# Patient Record
Sex: Female | Born: 1935 | Race: White | Hispanic: No | Marital: Married | Smoking: Never smoker
Health system: Southern US, Community
[De-identification: ages and names within clinical notes are randomized; demographics above are authoritative.]

## PROBLEM LIST (undated history)

## (undated) DIAGNOSIS — I482 Chronic atrial fibrillation, unspecified: Secondary | ICD-10-CM

## (undated) DIAGNOSIS — I429 Cardiomyopathy, unspecified: Secondary | ICD-10-CM

## (undated) DIAGNOSIS — I10 Essential (primary) hypertension: Secondary | ICD-10-CM

## (undated) DIAGNOSIS — R55 Syncope and collapse: Secondary | ICD-10-CM

## (undated) DIAGNOSIS — I739 Peripheral vascular disease, unspecified: Secondary | ICD-10-CM

## (undated) DIAGNOSIS — E039 Hypothyroidism, unspecified: Secondary | ICD-10-CM

## (undated) DIAGNOSIS — K219 Gastro-esophageal reflux disease without esophagitis: Secondary | ICD-10-CM

## (undated) DIAGNOSIS — I779 Disorder of arteries and arterioles, unspecified: Secondary | ICD-10-CM

## (undated) DIAGNOSIS — M1990 Unspecified osteoarthritis, unspecified site: Secondary | ICD-10-CM

## (undated) HISTORY — PX: CORNEAL TRANSPLANT: SHX108

## (undated) HISTORY — DX: Unspecified osteoarthritis, unspecified site: M19.90

## (undated) HISTORY — DX: Syncope and collapse: R55

## (undated) HISTORY — DX: Peripheral vascular disease, unspecified: I73.9

## (undated) HISTORY — DX: Cardiomyopathy, unspecified: I42.9

## (undated) HISTORY — DX: Chronic atrial fibrillation, unspecified: I48.20

## (undated) HISTORY — DX: Gastro-esophageal reflux disease without esophagitis: K21.9

## (undated) HISTORY — DX: Disorder of arteries and arterioles, unspecified: I77.9

## (undated) HISTORY — PX: CHOLECYSTECTOMY: SHX55

## (undated) HISTORY — PX: OTHER SURGICAL HISTORY: SHX169

---

## 2005-01-27 ENCOUNTER — Ambulatory Visit: Payer: Self-pay | Admitting: Cardiology

## 2005-07-21 ENCOUNTER — Ambulatory Visit: Payer: Self-pay | Admitting: Cardiology

## 2005-07-22 ENCOUNTER — Ambulatory Visit: Payer: Self-pay | Admitting: Cardiology

## 2005-07-28 ENCOUNTER — Ambulatory Visit: Payer: Self-pay | Admitting: Cardiology

## 2005-09-22 ENCOUNTER — Ambulatory Visit: Payer: Self-pay | Admitting: Cardiology

## 2005-12-04 ENCOUNTER — Ambulatory Visit: Payer: Self-pay | Admitting: Cardiology

## 2005-12-30 ENCOUNTER — Ambulatory Visit: Payer: Self-pay | Admitting: Cardiology

## 2006-06-14 ENCOUNTER — Ambulatory Visit: Payer: Self-pay | Admitting: Cardiology

## 2007-09-07 ENCOUNTER — Ambulatory Visit: Payer: Self-pay | Admitting: Cardiology

## 2008-10-10 DIAGNOSIS — I4892 Unspecified atrial flutter: Secondary | ICD-10-CM

## 2008-10-10 DIAGNOSIS — I2510 Atherosclerotic heart disease of native coronary artery without angina pectoris: Secondary | ICD-10-CM | POA: Insufficient documentation

## 2008-10-10 DIAGNOSIS — I10 Essential (primary) hypertension: Secondary | ICD-10-CM | POA: Insufficient documentation

## 2008-10-10 DIAGNOSIS — R55 Syncope and collapse: Secondary | ICD-10-CM

## 2010-05-26 NOTE — Assessment & Plan Note (Signed)
[facility]                          [facility]   NAME:[name] [name], [name] [name]            MRN:          [id number]  DATE:06/14/2006                            DOB:          [birth year]    HISTORY OF PRESENT ILLNESS:  The patient is an 77 year old female with a  history of palpitations and vasovagal syncope.  The patient has been  doing well.  She has had no recurrent spells.  Unfortunately, she took  herself off of beta blocker therapy.  Planned to go on a trip and she is  concerned that she may not remember when to take the medications.  She  has also had a positive sleep study but continues to refuse treatment  with CPAP.   MEDICATIONS:  Currently none.   PHYSICAL EXAMINATION:  VITAL SIGNS:  Blood pressure 133/88, heart rate  is 82 beats per minute.  Weight 152 pounds.  GENERAL:  Well-nourished white female no apparent distress.  EYES:  Pupils and sclerae clear.  Conjunctivae clear.  NECK:  Supple.  Normal carotid upstroke.  No carotid bruits.  LUNGS:  Clear breath sounds bilaterally.  HEART:  Regular rate and rhythm.  Normal S1, S2.  No murmurs, rubs, or  gallops.  ABDOMEN:  Soft.  EXTREMITIES:  No cyanosis, clubbing, or edema.  NEURO:  The patient is alert and oriented, grossly nonfocal.   PROBLEM LIST:  1. History of pulmonary emboli.  No recurrence by CT.  2. Vasovagal syncope.  No recurrence.  3. Mild left ventricular dysfunction, ejection fraction 45%.  4. Hypertension.  5. Paroxysmal atrial flutter.  6. Obstructive sleep apnea.   PLAN:  1. The patient was told to restart her beta blocker therapy.  She will      do so with Toprol XL 50 mg a day.  2. She continues to decline CPAP treatment.  3. No further cardiovascular workup is required.  The patient is      currently asymptomatic.     Learta Codding, MD,FACC  Electronically Signed    GED/MedQ  DD: 06/14/2006  DT: 06/14/2006  Job #: [id number]   cc:   Doreen Beam

## 2010-05-26 NOTE — Assessment & Plan Note (Signed)
[facility]                          [facility]   NAME:[name], [name] CHISOM            MRN:          [id number]  DATE:09/07/2007                            DOB:          13-Sep-1923    PRIMARY CARDIOLOGIST:  Learta Codding, MD,FACC   REASON FOR VISIT:  One-year followup.   [name] [name] reports no interim development of symptoms suggestive of angina  pectoris, decompensated heart failure, or recurrent tachy palpitations.   She does refer to a sensation of bilateral leg cramps, which occurs only  when she lays in bed at night.  However, she denies any symptoms  suggestive of intermittent claudication.   Electrocardiogram today reveals [name] SR of 74 bpm with normal axis and  question of prior septal infarct; there were no changes from her  previous study of 1 year ago.   CURRENT MEDICATIONS:  1. Toprol-XL 50 mg daily.  2. Aspirin 81 daily.   PHYSICAL EXAMINATION:  VITAL SIGNS:  Blood pressure 115/65; pulse 76,  regular; and weight 163.  GENERAL:  [name] [name], sitting upright, in no distress.  HEENT:  Normocephalic and atraumatic.  NECK:  Palpable bilateral carotid pulses without bruits; no JVD at 90  degrees.  LUNGS:  Clear to auscultation in all fields.  HEART:  RRR (S1S2) no significant murmurs.  No rubs or gallops.  ABDOMEN:  Protuberant, nontender.  EXTREMITIES:  Palpable dorsalis pedis pulses with no significant edema.  NEURO:  No focal deficit.   IMPRESSION:  1. History of paroxysmal atrial flutter.  1a.  Maintaining NSR.  1. Mild left ventricular dysfunction.  2a.  Ejection fraction of 45% by 2-D echo, January 2007.  1. History of pulmonary emboli.  3a.  Treated with 67-month course of Coumadin.  3b.  Associated right lower extremity thrombus.  1. Hypertension.  2. Obstructive sleep apnea, refuses CPAP.  3. History of vasovagal syncope.   PLAN:  1. Continue current medication regimen.  2. Consider repeat  2-D echo at time of next scheduled followup, for      reassessment of left ventricular function.  3. Schedule return-to-clinic followup with Dr. Andee Lineman in 1 year, or      sooner as needed.      Gene Serpe, PA-C  Electronically Signed      Learta Codding, MD,FACC  Electronically Signed   GS/MedQ  DD: 09/07/2007  DT: 09/08/2007  Job #: [id number]   cc:   Doreen Beam, MD

## 2010-05-29 NOTE — Assessment & Plan Note (Signed)
[facility]                            [facility]   NAME:[name] [name], [name] [name]            MRN:          [id number]  DATE:09/22/2005                            DOB:          February 01, 1923    HISTORY OF PRESENT ILLNESS:  Patient is a 77 year old female.  The patient  is a 77 year old female.  The patient has history of vasovagal syncope.  She  wore recently an event monitor.  No significant pauses were noted.  The  patient did have a couple of brief runs of atrial tachycardia, 3 to 4 beats,  but was asymptomatic with this.  She denies any substernal chest pressure,  shortness of breath, orthopnea, PND.  Previous CT scan did not show any  recurrent pulmonary emboli.   MEDICATIONS:  1. Toprol XL 100 mg a day.  2. Aspirin 81 mg a day.   PHYSICAL EXAMINATION:  VITAL SIGNS:  Blood pressure is 100/64, heart 64.  NECK:  Normal carotid upstroke.  No carotid bruits.  LUNGS:  Clear breath sounds bilaterally.  HEART:  Regular rate and rhythm.  Normal S1, S2.  ABDOMEN:  Soft.  EXTREMITIES:  No cyanosis, clubbing or edema.   PROBLEM LIST:  1. History of pulmonary emboli, no recurrence by CT.  2. Vasovagal syncope, no recurrence.  3. Mild LV dysfunction, ejection fraction 45%.  4. History of paroxysmal atrial flutter.   PLAN:  1. The patient is doing well, appears she has no recurrent syncope.  At      this point, I think she would __________  more with Dr. Graciela Husbands.  2. The patient does have frequent PACs and atrial tachycardia on the event      monitor.  She has recurrent episodes of pre-syncope.  Will plan on a      CardioMed monitor.  3. Patient will follow up with [place] in 6 months and will check PFTs and      apnea link.                                   Learta Codding, MD,FACC   GED/MedQ  DD:  09/22/2005  DT:  09/23/2005  Job #:  [id number]   cc:   Doreen Beam

## 2010-05-29 NOTE — Assessment & Plan Note (Signed)
[facility]                          [facility]   NAME:[name] [name], [name] [name]            MRN:          [id number]  DATE:12/30/2005                            DOB:          [birth year]    HISTORY OF PRESENT ILLNESS:  Patient is an 77 year old with prior  history of vasovagal episodes.  The patient has had no recurrent  syncope.  She has been ruled out for pulmonary emboli by CT scan.  The  patient most recently wore a CardioNet monitor which showed a single  episode of PSVT of 10 beats, but no other significant arrhythmias.  Patient states that she has been doing quite well, and denies any chest  pain or shortness of breath.  She did have an ApneLink monitor donewhich  was positive with evidence for sleep disorder breathing.  She also had a  formal sleep study done, but the results are currently not available  yet.   MEDICATIONS:  1. Toprol XL 100 mg a day.  2. Aspirin 81 mg p.o. daily.   PHYSICAL EXAMINATION:  Blood pressure is 140/76.  Heart rate 72.  NECK  EXAM:  Normal carotid upstroke.  No bruits.  LUNGS:  Clear breath sounds bilaterally.  HEART:  Regular rate and rhythm.  Normal S1 and S2.  No murmurs, rubs or  gallops.  ABDOMEN:  Soft.  EXTREMITY EXAM:  No edema.   PROBLEM LIST:  1. History of pulmonary emboli.  No recurrence by computed tomography.  2. Vasovagal syncope.  No recurrence.      a.     Status post CardioNet monitor with a single episode       paroxysmal supraventricular tachycardia at 10 beats.  3. Mild left ventricular dysfunction.  Ejection fraction 45%.  4. History of paroxysmal atrial flutter, currently in normal sinus      rhythm.  5. Rule out sleep disordered breathing.   PLAN:  1. Th patient has a positive sleep study, but is not willing to      consider CPAP at this time  2. Cardiac monitor not showing any significant arrhythmias.  Continue      medical therapy.  3. We will follow the  patient up in 1 year.     Learta Codding, MD,FACC  Electronically Signed    GED/MedQ  DD: 12/30/2005  DT: 12/30/2005  Job #: [id number]

## 2010-05-29 NOTE — Assessment & Plan Note (Signed)
[facility]                            [facility]   NAME:[name] [name], [name] [name]            MRN:          [id number]  DATE:07/28/2005                            DOB:          [birth year]    PRIMARY CARDIOLOGIST:  Learta Codding, MD, [facility].   REASON FOR OFFICE VISIT:  [name] [name] now returns for a scheduled office  followup.  Please refer to my initial consultation note of July 21, 2005,  for full details.  At that time, the patient presented for post hospital  followup and consultation for recurrent syncope, after being recently  hospitalized here at [facility].  It was noted by Dr. Andee Lineman at the time of  followup that she had actually been seen by [place] initially in 2005, by Dr.  Andee Lineman, for syncope which was felt to be vasovagal at that time.  No further  cardiac workup was recommended at that time.  Most recently, the patient was  diagnosed with pulmonary embolus in January 2007, and was treated with a 6-  month course of Coumadin.  She had just been taken off Coumadin prior to our  recent visit.  During this current hospitalization, the patient was kept for  overnight observation and discharged with a diagnosis of possible TIA.  In  followup, we recommended repeating a CT scan of the chest as well as the D-  dimer to definitively exclude any recurrent or persistent pulmonary emboli.  A CT scan of the chest was done and it showed no evidence of pulmonary  embolus, in fact, the small emboli seen in January '07, had since resolved.  There was, however, indication of a hypodense focus in the pancreatic tail  suggestive of either a pseudocyst versus a possible cystic pancreatic  neoplasm.  The patient was also placed on a 30-day event monitor.  She  activated this for 1 spell and telemetry reviewed today reveals normal  sinus rhythm with frequent PACs.   Regarding symptoms, the patient reports only 1 spell which consists of her  sitting at  the computer desk and suddenly feeling like she is about to pass  out.  This time, however, she was able to place herself in bed and states  that she slept for 1 hour.  When she awoke, she felt fine and had no  recurrent symptoms.   A recent D-dimer, ordered by [place], was negative (421).   CURRENT MEDICATIONS:  1.  Toprol XL 100 mg every day.  2.  Aspirin 81 mg every day.   PHYSICAL EXAMINATION:  VITAL SIGNS:  Blood pressure 114/60, pulse 74  regular, weight 150.  NECK:  Palpable carotid pulses without bruits.  LUNGS:  Clear to auscultation all fields.  HEART:  Regular rate and rhythm (S1 and S2).  No significant murmurs.  EXTREMITIES:  Palpable pulses without edema.  NEUROLOGIC:  No focal deficit.   IMPRESSION:  [name] [name] is an 77 year old female, recently referred to [place] for  outpatient consultation of recurrent syncope, previously seen by [place] in 2005,  with what was felt to be vasovagal syncope at that time.  She does have a  history of pulmonary embolus, diagnosed in January 2007, but a followup CT  scan recently ordered by [place] shows a complete resolution of this.  The  patient was treated with a 21-month course of Coumadin.  The patient has no known history of coronary artery disease and has a  history of paroxysmal atrial flutter with rapid ventricular response,  spontaneously converted to normal sinus rhythm in January 2007.  She also  had mild left ventricular dysfunction (ejection fraction 45%) by  echocardiogram in January 2007, consistent with a restrictive physiology.   PLAN:  At this point, the etiology of the patient's recent episode of  syncope is unclear.  We have excluded recurrent pulmonary embolus as a  possible etiology, but we will continue to keep her on the 30-day event  monitor, which she just recently started to exclude possible dysrhythmia.  Moreover we will proceed with scheduling her for an evaluation with Dr.  Sherryl Manges for further recommendations (i.e.,  tilt table testing).  We  have also requested records pertaining to a prior carotid Doppler ultrasound  reportedly done back in January 2007, which suggested presence of  nonocclusive disease.                                   Gene Serpe, PA-C                                Learta Codding, MD, [facility]   GS/MedQ  DD:  07/28/2005  DT:  07/28/2005  Job #:  [id number]   cc:   Doreen Beam

## 2010-05-29 NOTE — Assessment & Plan Note (Signed)
[facility]                            [facility]   NAME:[name] [name], [name] [name]            MRN:          [id number]  DATE:07/21/2005                            DOB:          [birth year]    CONSULTATION NOTE:   REFERRING PHYSICIAN:  Dr. Doreen Beam   DATE OF CONSULTATION:  July 21, 2005   PRIMARY CARDIOLOGIST:  Dr. Lewayne Bunting   REASON FOR CONSULTATION:  Recurrent syncope.   Ms. Coleson is an 77 year old female, who was initially referred to [place] in June  2005 for syncope and was seen by Dr. Lewayne Bunting, and who now presents to [place]  following recent hospitalization here at [facility] with recurrent syncope.   In June 2005, the patient was felt to have vasovagal syncope and no further  cardiac workup was recommended by Dr. Andee Lineman at that time.  She was noted to  have no prior history of coronary artery disease and had negative cardiac  markers and EKGs.   In January 2007, the patient presented with syncope and was diagnosed with  pulmonary embolus and placed on Coumadin for 6 months.  In fact, she was  just taken off Coumadin yesterday during her scheduled post-hospital  followup.  At the same time in January, the patient reports to me that she  was found to have some carotid artery disease (carotid Dopplers are  currently unavailable) but apparently not sufficiently occlusive to require  intervention.   Most recently, the patient was hospitalized here overnight following a  syncopal episode.  She was found slumped over on her computer desk, by her  husband, and was taken to the emergency room.  She ruled out for myocardial  infarction with negative serial cardiac markers.  A CT scan of the head was  negative.  Telemetry apparently was benign.   The patient was discharged with diagnosis of possible TIA.  She is now  referred to [place] today for further evaluation.   Since her recent hospitalization, the patient reports no further  syncope.  Of note, she also denies any remote or recent history of exertional angina  pectoris, dyspnea, paroxysmal  nocturnal dyspnea, orthopnea, or  tachypalpitations.   ALLERGIES:  ACETAMINOPHEN, NONSTEROIDALS, AND SUGAR SUBSTITUTE.   CURRENT MEDICATIONS:  1.  Toprol XL 100 daily.  2.  Aspirin 81 daily.   PAST MEDICAL HISTORY:  As outlined above.  Additionally, pancreatic cyst,  reported history of congestive heart failure, status post cholecystectomy,  irritable bowel syndrome, and hyperlipidemia.   SOCIAL HISTORY:  The patient is married, has three children.  She has never  smoked tobacco and denies alcohol use.   FAMILY HISTORY:  Negative for premature coronary artery disease, sudden  death, or history of arrhythmias.   REVIEW OF SYSTEMS:  As outlined per HPI, remaining systems negative.   PHYSICAL EXAMINATION:  VITAL SIGNS:  Blood pressure 110/74, pulse 50,  regular, weight 151.  GENERAL:  An 77 year old female, sitting upright, in no apparent distress.  HEENT:  Normocephalic, atraumatic.  NECK:  Palpable carotid pulses without bruits.  LUNGS:  Clear to auscultation in all fields.  HEART:  Regular rate  and rhythm (S1 and S2), no murmurs, rubs, or gallops.  ABDOMEN:  Soft, nontender with intact bowel sounds.  EXTREMITIES:  Palpable distal pulses without significant pedal edema.  NEUROLOGICAL:  No focal deficit.   IMPRESSION:  1.  Recurrent syncope.      1.  Associated pulmonary embolus January 2007.      2.  Mild, nonocclusive right posterior tibial vein thrombus by venous          duplex imaging January 2007.  2.  Mild left ventricular dysfunction (ejection fraction 45% by      echocardiogram January 2007).      1.  Consistent with restrictive physiology.      2.  Moderate tricuspid regurgitation.  3.  History of atrial flutter with rapid ventricular response.      1.  Spontaneous conversion to normal sinus rhythm January 2007.  4.  Question recent transient  ischemia attack.  5.  Hyperlipidemia.   PLAN:  The patient presents to the office following a recent recurrent  episode of syncope following overnight observation here at [facility].  She was discharged with diagnosis of possible TIA.  Of note,  however, a D-dimer was not drawn most recently but she did have an elevated  D-dimer of 639 in May 2007.  She has just completed a 71-month course of  anticoagulation for treatment of pulmonary embolus.  She states that this  most recent episode of syncope was unlike that which she experienced in  January 2007.  Again, she reported no prodromal symptoms or warning of any  kind other than the experience of feeling tired just prior to blacking  out.  She also reports to me today that she felt confused for  approximately 1 hour after regaining consciousness, the duration of which  she reports was approximately 1 hour in duration as well.   Recommendation today is to repeat a D-dimer level as well as a CT scan of  the chest to exclude recurrent pulmonary embolus.  Additionally, we will  place her on an event monitor to exclude an arrhythmogenic etiology for her  syncope.  We will have her return to the clinic here to follow up with Dr.  Andee Lineman in 1 week for review and further recommendations.  In the interim, we  will also schedule her for an evaluation with Dr. Sherryl Manges regarding  further workup for her syncope (i.e., tilt table testing).                                   Gene Serpe, PA-C                                Learta Codding, MD, [facility]   GS/MedQ  DD:  07/21/2005  DT:  07/21/2005  Job #:  [id number]

## 2010-07-27 ENCOUNTER — Telehealth: Payer: Self-pay | Admitting: Cardiovascular Disease

## 2010-07-27 NOTE — Telephone Encounter (Signed)
Missed her home care appointment on Friday and today.  [name] [name] at [facility] said that she is not answering her door; thinking that she may be asleep. She has notified her next of kin and just wanted [place] to know.

## 2012-08-16 DIAGNOSIS — R55 Syncope and collapse: Secondary | ICD-10-CM

## 2013-11-14 ENCOUNTER — Other Ambulatory Visit: Payer: Self-pay | Admitting: *Deleted

## 2015-08-27 ENCOUNTER — Inpatient Hospital Stay (HOSPITAL_COMMUNITY)
Admission: AD | Admit: 2015-08-27 | Discharge: 2015-09-02 | DRG: 308 | Disposition: A | Discharge status: Home Health | Payer: Medicare Other | Source: Other Acute Inpatient Hospital | Attending: Internal Medicine | Admitting: Internal Medicine

## 2015-08-27 DIAGNOSIS — R072 Precordial pain: Secondary | ICD-10-CM | POA: Diagnosis not present

## 2015-08-27 DIAGNOSIS — I082 Rheumatic disorders of both aortic and tricuspid valves: Secondary | ICD-10-CM | POA: Diagnosis present

## 2015-08-27 DIAGNOSIS — E871 Hypo-osmolality and hyponatremia: Secondary | ICD-10-CM | POA: Diagnosis present

## 2015-08-27 DIAGNOSIS — E039 Hypothyroidism, unspecified: Secondary | ICD-10-CM | POA: Diagnosis present

## 2015-08-27 DIAGNOSIS — I482 Chronic atrial fibrillation: Secondary | ICD-10-CM | POA: Diagnosis present

## 2015-08-27 DIAGNOSIS — I447 Left bundle-branch block, unspecified: Secondary | ICD-10-CM | POA: Diagnosis present

## 2015-08-27 DIAGNOSIS — I5023 Acute on chronic systolic (congestive) heart failure: Secondary | ICD-10-CM | POA: Diagnosis present

## 2015-08-27 DIAGNOSIS — R071 Chest pain on breathing: Secondary | ICD-10-CM | POA: Diagnosis not present

## 2015-08-27 DIAGNOSIS — R296 Repeated falls: Secondary | ICD-10-CM | POA: Diagnosis present

## 2015-08-27 DIAGNOSIS — R0602 Shortness of breath: Secondary | ICD-10-CM | POA: Diagnosis not present

## 2015-08-27 DIAGNOSIS — R159 Full incontinence of feces: Secondary | ICD-10-CM | POA: Diagnosis present

## 2015-08-27 DIAGNOSIS — I420 Dilated cardiomyopathy: Secondary | ICD-10-CM

## 2015-08-27 DIAGNOSIS — R05 Cough: Secondary | ICD-10-CM | POA: Diagnosis not present

## 2015-08-27 DIAGNOSIS — I4892 Unspecified atrial flutter: Secondary | ICD-10-CM | POA: Diagnosis present

## 2015-08-27 DIAGNOSIS — I959 Hypotension, unspecified: Secondary | ICD-10-CM | POA: Diagnosis present

## 2015-08-27 DIAGNOSIS — R0789 Other chest pain: Secondary | ICD-10-CM | POA: Diagnosis present

## 2015-08-27 DIAGNOSIS — I110 Hypertensive heart disease with heart failure: Secondary | ICD-10-CM | POA: Diagnosis present

## 2015-08-27 DIAGNOSIS — I4891 Unspecified atrial fibrillation: Secondary | ICD-10-CM | POA: Diagnosis not present

## 2015-08-27 DIAGNOSIS — K219 Gastro-esophageal reflux disease without esophagitis: Secondary | ICD-10-CM | POA: Diagnosis present

## 2015-08-27 DIAGNOSIS — I5021 Acute systolic (congestive) heart failure: Secondary | ICD-10-CM

## 2015-08-27 DIAGNOSIS — R29898 Other symptoms and signs involving the musculoskeletal system: Secondary | ICD-10-CM | POA: Diagnosis not present

## 2015-08-27 DIAGNOSIS — R079 Chest pain, unspecified: Secondary | ICD-10-CM | POA: Diagnosis not present

## 2015-08-27 DIAGNOSIS — R059 Cough, unspecified: Secondary | ICD-10-CM | POA: Diagnosis present

## 2015-08-27 HISTORY — DX: Essential (primary) hypertension: I10

## 2015-08-27 HISTORY — DX: Hypothyroidism, unspecified: E03.9

## 2015-08-27 NOTE — Consult Note (Signed)
Admit date: 08/27/2015 Referring Physician  Dr. Guadalupe DawnKevin Greer, MD [facility] Primary Cardiologist  None Reason for Consultation  Aflutter with RVR  HPI: This is a very elderly, frail 82yo WF with a history of chronic atrial fibrillation not on chronic anticoagulation (patient does not know why but most likely due to frequent falls and advanced age), diverticulosis with chronic fecal incontinence, GERD, anxiety, arthritis, ? Pancreatic tumor (per medical [place]) and hypotension.  She apparently has a history of syncope in the past and she tells me that she has passed out several times in 7 of the past 17 years. She apparently was shopping yesterday and her legs got very heavy and wouldn't work and she has to stop.  [facility] stated that she had syncope but she denies any dizziness and says that she did not lose consciousness.  She denies any fever, chills but has had a cough that is minimally productive.  Over the past few days and noticed increased SOB.  She has also had chest discomfort for the past 1-2 days that she describes as a wire across her chest and her back that is not worse with inspiration but is worse laying supine.  [facility] and was found to be hyponatremic with Na 127 and normal trop .  CXR showed patchy density at the lung bases felt to be atelectalsis or mild basilar PNA.  She was hypertensive in the ER with BP 101-143/76-15111mmHg.  TSH was noted to be low at 5.72 and free T4 1.32.  She was found to be in atrial flutter with RVR and was given Cardizem bolus of 50mg  total and started on a gtt. Of note, [place] apparently was in [place] due to "syncope" but patient says that her spells are mainly that her legs get heavy and she cannot walk.  She did feel lightheaded but did not pass out a few days ago.    Cardiology is now asked to consult for further evaluation and treatment of aflutter and CP.     PMH:  No past medical  history on file.   PSH:  No past surgical history on file.  Allergies:  Review of patient's allergies indicates not on file. Prior to Admit Meds:   No prescriptions prior to admission.   Fam HX:   No family history on file. Social HX:    Social History   Social History  . Marital status: Married    Spouse name: N/A  . Number of children: N/A  . Years of education: N/A   Occupational History  . Not on file.   Social History Main Topics  . Smoking status: Not on file  . Smokeless tobacco: Not on file  . Alcohol use Not on file  . Drug use: Unknown  . Sexual activity: Not on file   Other Topics Concern  . Not on file   Social History Narrative  . No narrative on file     ROS:  All 11 ROS were addressed and are negative except what is stated in the HPI  Physical Exam: Blood pressure 113/67, pulse 73, temperature 97.9 F (36.6 C), temperature source Oral, resp. rate 17, height 5\' 1"  (1.549 m), weight 152 lb 14.4 oz (69.4 kg), SpO2 100 %.    General: Well developed, well nourished, in no acute distress Head: Eyes PERRLA, No xanthomas.   Normal cephalic and atramatic  Lungs:   Crackles at  left base Heart:   RRR S1 S2 Pulses are 2+ & equal.            No carotid bruit. No JVD.  No abdominal bruits. No femoral bruits. Abdomen: Bowel sounds are positive, abdomen soft and non-tender without masses  Msk:  Back normal, normal gait. Normal strength and tone for age. Extremities:   No clubbing, cyanosis or edema.  DP +1 Neuro: Alert and oriented X 3. Psych:  Good affect, responds appropriately    Labs:  No results found for: WBC, HGB, HCT, MCV, PLT No results for input(s): NA, K, CL, CO2, BUN, CREATININE, CALCIUM, PROT, BILITOT, ALKPHOS, ALT, AST, GLUCOSE in the last 168 hours.  Invalid input(s): LABALBU No results found for: PTT No results found for: INR, PROTIME No results found for: CKTOTAL, CKMB, CKMBINDEX, TROPONINI  No results found for: CHOL No results found for:  HDL No results found for: LDLCALC No results found for: TRIG No results found for: CHOLHDL No results found for: LDLDIRECT    Radiology:  No results found.  EKG:  Atrial flutter with RVR and LBBB  ASSESSMENT/PLAN: 1.  Chronic atrial flutter/atrial fibrillation now presenting with RVR. Unsure what triggered the rapid heart rate.  Her HR is now in the 50-60's off Cardizem gtt.  Will keep off Cardizem gtt for now as she is bradycardic.  She has not been on chronic anticoagulation in the past and I suspect due to frequent falls and advanced age.  Her Free T4 is elevated along with her TSH.  2.  Chest pain that is somewhat atypical and worse with laying flat.  She does have a chest xray with ? Bibasilar PNA.  She has never smoked and really has no cardiac risk factors other than advanced age.  She has a LBBB on EKG with no old EKG to compare to.  Given her advanced age would pursue a more conservative approach.  Will check a 2D echo to assess LVF.  Will cycle troponins and further workup based on echo.    3.  SOB of several days duration with cough but no fever or chills.  CXR with ? Bibasilar PNA.  Will check 2D echo to assess LVF and check BNP to make sure there is not a component of CHF.  She was in aflutter with RVR on arrival so this could have been etiology of SOB with CHF although CXR not consistent with that.   Also unknown duration of aflutter with RVR so could have tachycardia induced DCM.    4.  Leg heaviness and weakness with frequent falls of unknown etiology.  She says that she has not passed out although she has a history of neurocardiogenic syncope on her PMH list.    Armanda Magicraci Lysha Schrade, MD  08/27/2015  11:38 PM

## 2015-08-28 ENCOUNTER — Other Ambulatory Visit (HOSPITAL_COMMUNITY): Payer: Medicare Other

## 2015-08-28 ENCOUNTER — Encounter (HOSPITAL_COMMUNITY): Payer: Self-pay | Admitting: Family Medicine

## 2015-08-28 ENCOUNTER — Inpatient Hospital Stay (HOSPITAL_COMMUNITY): Payer: Medicare Other

## 2015-08-28 DIAGNOSIS — R072 Precordial pain: Secondary | ICD-10-CM

## 2015-08-28 DIAGNOSIS — E039 Hypothyroidism, unspecified: Secondary | ICD-10-CM | POA: Diagnosis present

## 2015-08-28 DIAGNOSIS — E038 Other specified hypothyroidism: Secondary | ICD-10-CM

## 2015-08-28 DIAGNOSIS — R29898 Other symptoms and signs involving the musculoskeletal system: Secondary | ICD-10-CM | POA: Diagnosis present

## 2015-08-28 DIAGNOSIS — R079 Chest pain, unspecified: Secondary | ICD-10-CM | POA: Diagnosis present

## 2015-08-28 DIAGNOSIS — E871 Hypo-osmolality and hyponatremia: Secondary | ICD-10-CM | POA: Diagnosis present

## 2015-08-28 DIAGNOSIS — R05 Cough: Secondary | ICD-10-CM | POA: Diagnosis present

## 2015-08-28 DIAGNOSIS — R0602 Shortness of breath: Secondary | ICD-10-CM

## 2015-08-28 DIAGNOSIS — I4891 Unspecified atrial fibrillation: Secondary | ICD-10-CM

## 2015-08-28 DIAGNOSIS — R059 Cough, unspecified: Secondary | ICD-10-CM | POA: Diagnosis present

## 2015-08-28 DIAGNOSIS — R0789 Other chest pain: Secondary | ICD-10-CM

## 2015-08-28 LAB — CBC WITH DIFFERENTIAL/PLATELET
BASOS ABS: 0 10*3/uL (ref 0.0–0.1)
BASOS PCT: 0 %
Eosinophils Absolute: 0 10*3/uL (ref 0.0–0.7)
Eosinophils Relative: 0 %
HEMATOCRIT: 34.1 % — AB (ref 36.0–46.0)
HEMOGLOBIN: 11.4 g/dL — AB (ref 12.0–15.0)
LYMPHS PCT: 17 %
Lymphs Abs: 1.2 10*3/uL (ref 0.7–4.0)
MCH: 31.4 pg (ref 26.0–34.0)
MCHC: 33.4 g/dL (ref 30.0–36.0)
MCV: 93.9 fL (ref 78.0–100.0)
MONO ABS: 0.5 10*3/uL (ref 0.1–1.0)
Monocytes Relative: 7 %
NEUTROS ABS: 5.1 10*3/uL (ref 1.7–7.7)
NEUTROS PCT: 76 %
Platelets: 193 10*3/uL (ref 150–400)
RBC: 3.63 MIL/uL — AB (ref 3.87–5.11)
RDW: 11.9 % (ref 11.5–15.5)
WBC: 6.7 10*3/uL (ref 4.0–10.5)

## 2015-08-28 LAB — COMPREHENSIVE METABOLIC PANEL
ALT: 27 U/L (ref 14–54)
AST: 30 U/L (ref 15–41)
Albumin: 3.1 g/dL — ABNORMAL LOW (ref 3.5–5.0)
Alkaline Phosphatase: 64 U/L (ref 38–126)
Anion gap: 3 — ABNORMAL LOW (ref 5–15)
BUN: 11 mg/dL (ref 6–20)
CHLORIDE: 99 mmol/L — AB (ref 101–111)
CO2: 26 mmol/L (ref 22–32)
Calcium: 8.2 mg/dL — ABNORMAL LOW (ref 8.9–10.3)
Creatinine, Ser: 1.2 mg/dL — ABNORMAL HIGH (ref 0.44–1.00)
GFR, EST AFRICAN AMERICAN: 45 mL/min — AB (ref >60)
GFR, EST NON AFRICAN AMERICAN: 39 mL/min — AB (ref >60)
Glucose, Bld: 123 mg/dL — ABNORMAL HIGH (ref 65–99)
POTASSIUM: 4.5 mmol/L (ref 3.5–5.1)
SODIUM: 128 mmol/L — AB (ref 135–145)
Total Bilirubin: 0.6 mg/dL (ref 0.3–1.2)
Total Protein: 5.6 g/dL — ABNORMAL LOW (ref 6.5–8.1)

## 2015-08-28 LAB — BRAIN NATRIURETIC PEPTIDE
B NATRIURETIC PEPTIDE 5: 820.5 pg/mL — AB (ref 0.0–100.0)
B Natriuretic Peptide: 768.9 pg/mL — ABNORMAL HIGH (ref 0.0–100.0)

## 2015-08-28 LAB — URINALYSIS, ROUTINE W REFLEX MICROSCOPIC
Bilirubin Urine: NEGATIVE
Glucose, UA: NEGATIVE mg/dL
Hgb urine dipstick: NEGATIVE
Ketones, ur: NEGATIVE mg/dL
Nitrite: NEGATIVE
PROTEIN: NEGATIVE mg/dL
SPECIFIC GRAVITY, URINE: 1.015 (ref 1.005–1.030)
pH: 5.5 (ref 5.0–8.0)

## 2015-08-28 LAB — URINE MICROSCOPIC-ADD ON: RBC / HPF: NONE SEEN RBC/hpf (ref 0–5)

## 2015-08-28 LAB — MRSA PCR SCREENING: MRSA by PCR: NEGATIVE

## 2015-08-28 LAB — TSH: TSH: 4.255 u[IU]/mL (ref 0.350–4.500)

## 2015-08-28 LAB — TROPONIN I
Troponin I: 0.03 ng/mL (ref <0.03)
Troponin I: <0.03 ng/mL (ref <0.03)

## 2015-08-28 LAB — MAGNESIUM: MAGNESIUM: 2 mg/dL (ref 1.7–2.4)

## 2015-08-28 LAB — OSMOLALITY: OSMOLALITY: 272 mosm/kg — AB (ref 275–295)

## 2015-08-28 LAB — SODIUM, URINE, RANDOM: SODIUM UR: 22 mmol/L

## 2015-08-28 LAB — OSMOLALITY, URINE: Osmolality, Ur: 347 mOsm/kg (ref 300–900)

## 2015-08-28 LAB — T4, FREE: FREE T4: 1.21 ng/dL — AB (ref 0.61–1.12)

## 2015-08-28 LAB — ECHOCARDIOGRAM LIMITED
HEIGHTINCHES: 61 in
WEIGHTICAEL: 2432 [oz_av]

## 2015-08-28 LAB — PROTIME-INR
INR: 1.27
Prothrombin Time: 16 seconds — ABNORMAL HIGH (ref 11.4–15.2)

## 2015-08-28 LAB — PROCALCITONIN

## 2015-08-28 IMAGING — CT CT ANGIO CHEST
1 series · 10 of 32 positions shown · IV contrast (Iohexol (Omnipaque 350))
Comparison: Chest x-ray from earlier same day and chest CT dated
02/12/2011.

CLINICAL DATA: Shortness of breath for few days, episodes chest
pain.

EXAM:
CT ANGIOGRAPHY CHEST WITH CONTRAST
TECHNIQUE: Multidetector CT imaging of the chest was performed using the
standard protocol during bolus administration of intravenous
contrast. Multiplanar CT image reconstructions and MIPs were
obtained to evaluate the vascular anatomy.
CONTRAST:  80 cc Isovue 370

[Series 402: coronals, idose (1) · coronal · 0.45mm/px · 10 of 168 slices shown]
[im 13/168  lung]
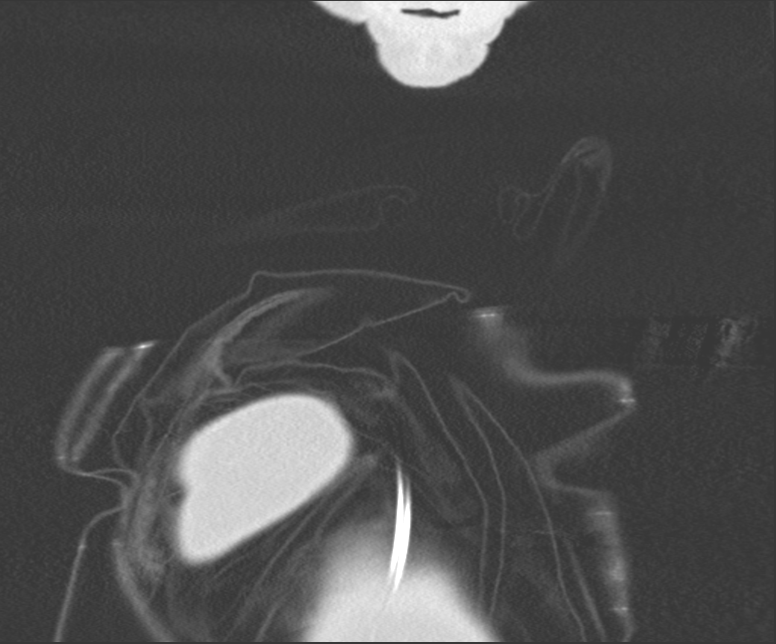
[im 31/168  mediastinal]
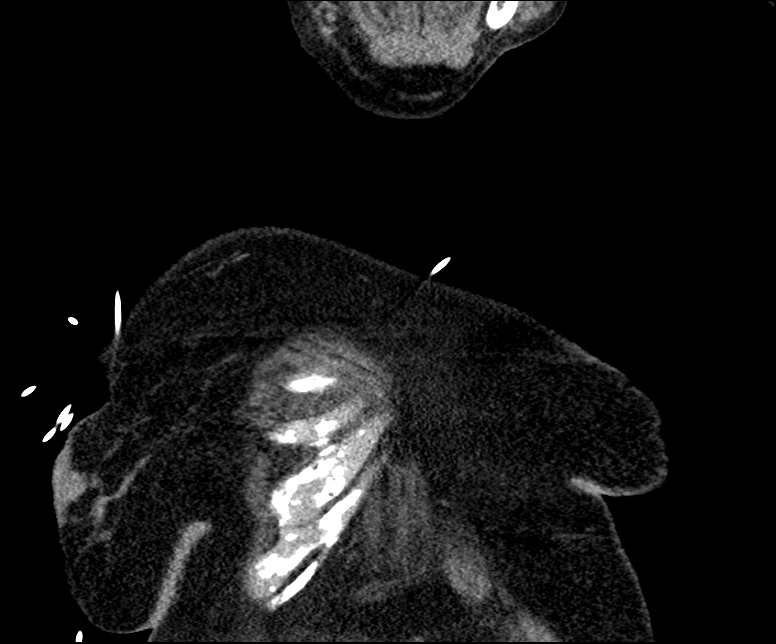
[im 44/168  lung]
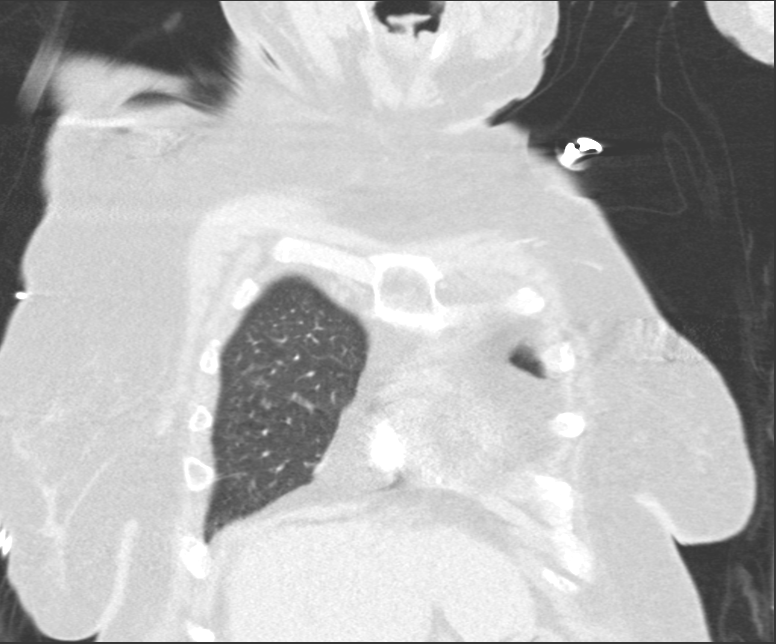
[im 62/168  mediastinal]
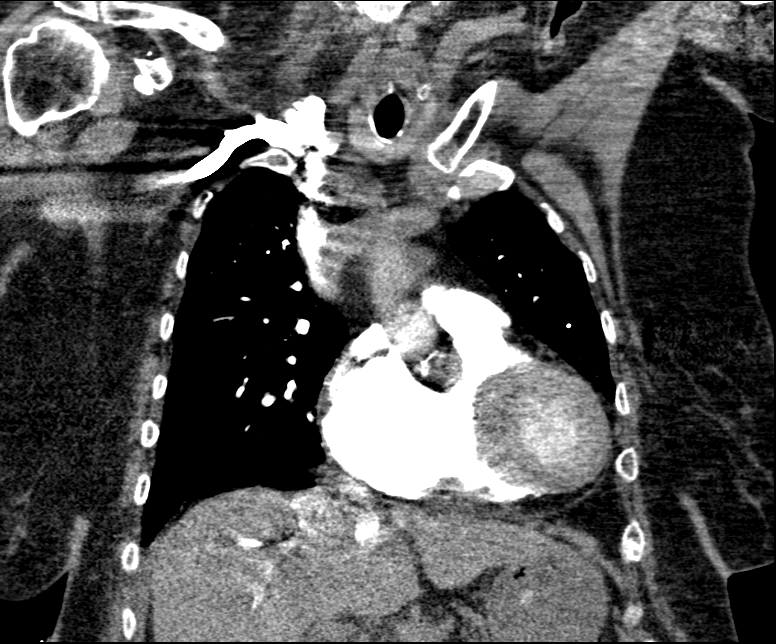
[im 75/168  lung]
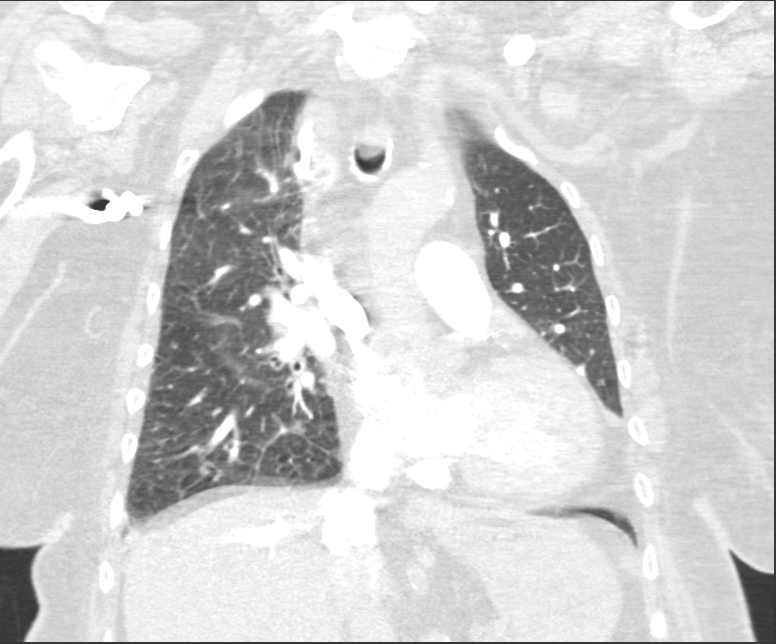
[im 93/168  mediastinal]
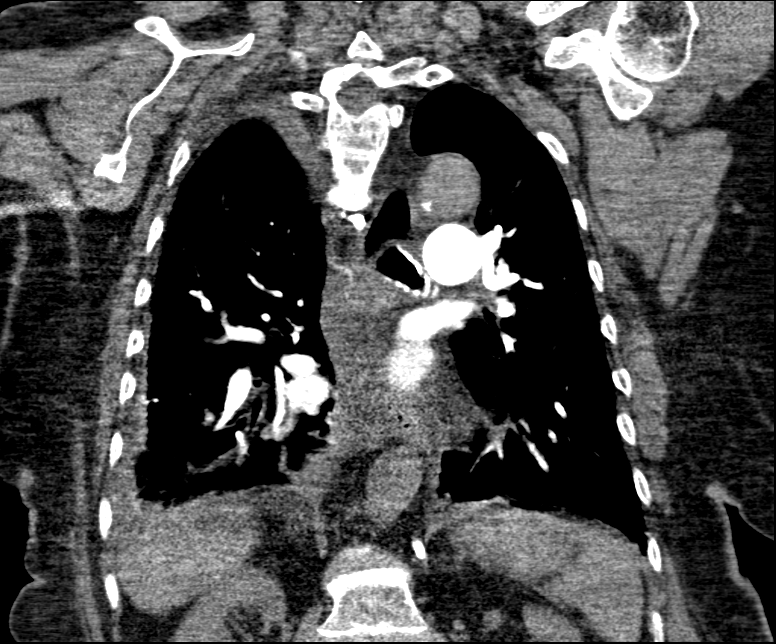
[im 106/168  lung]
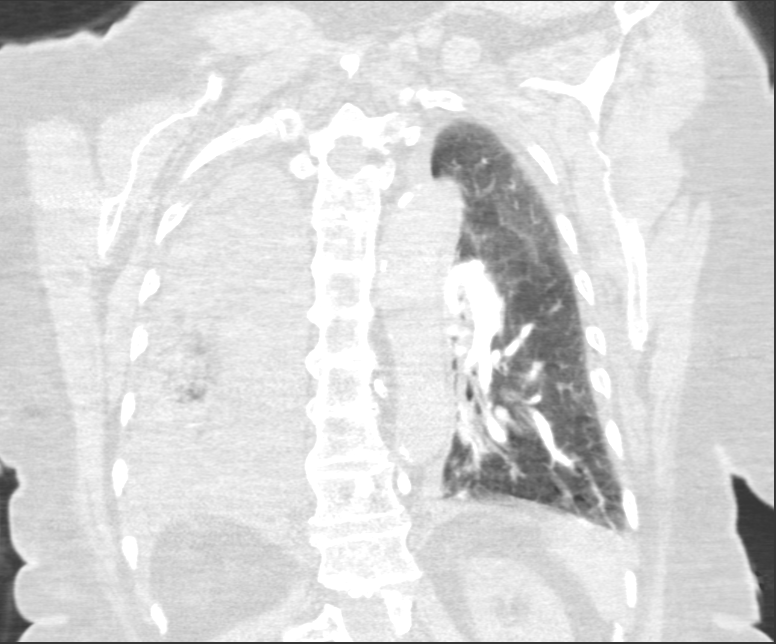
[im 124/168  mediastinal]
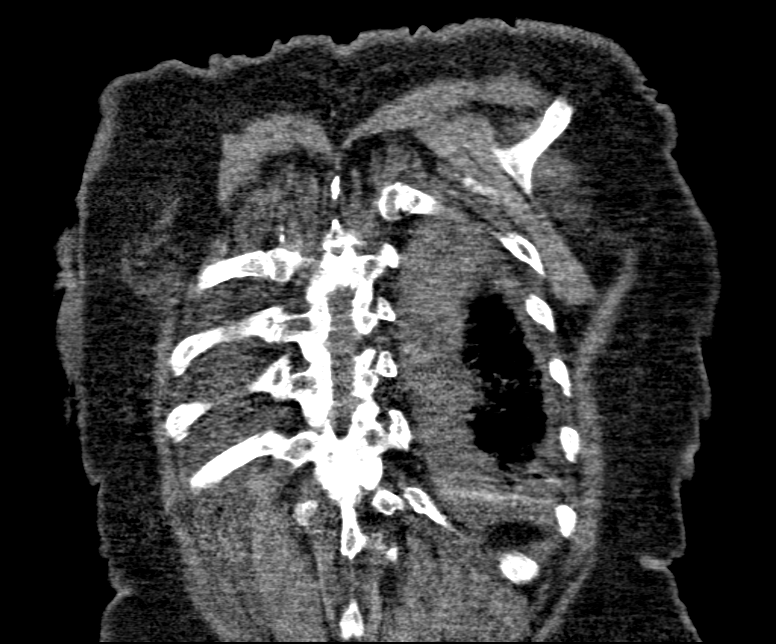
[im 137/168  lung]
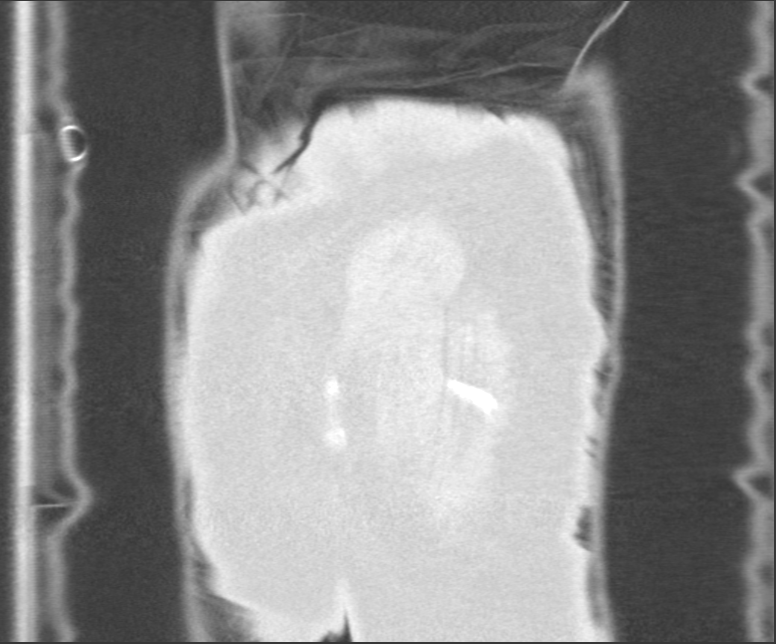
[im 155/168  mediastinal]
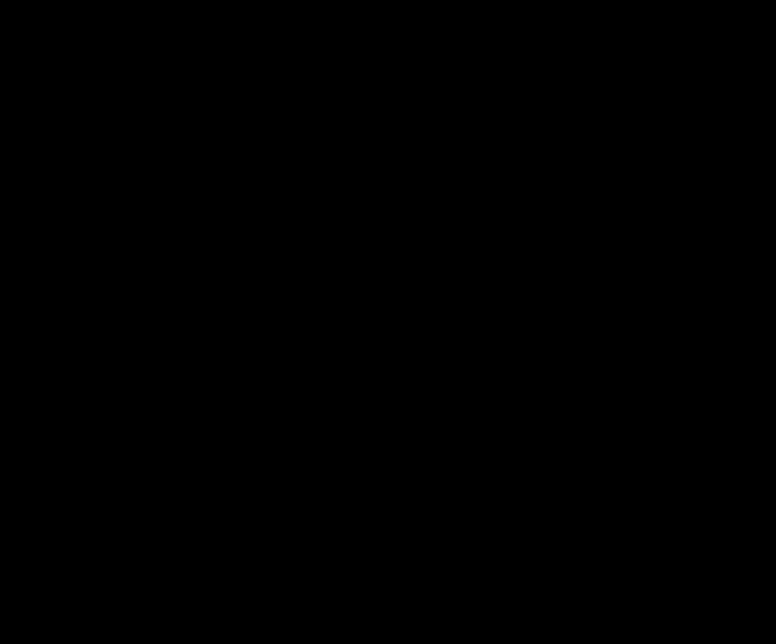

[10 of 32 positions shown; findings below may reference images not displayed]

FINDINGS: Mediastinum/Lymph Nodes: Some of the most peripheral segmental and
subsegmental pulmonary arteries are difficult to definitively
characterize due to patient breathing motion artifact but there is
no pulmonary embolism identified within the main, lobar or central
segmental pulmonary arteries bilaterally.

Atherosclerotic changes along the walls of the normal-caliber
thoracic aorta. No aortic aneurysm. Heart is enlarged. No
pericardial effusion. No mass or enlarged lymph nodes seen within
the mediastinum or perihilar regions. Esophagus is unremarkable.
Trachea appears normal.

Lungs/Pleura: Interstitial and peribronchial thickening at the lung
bases suggesting edema. Bilateral pleural effusions, moderate in
size, right slightly greater than left. Associated mild bibasilar
atelectasis. No evidence of pneumonia. No pneumothorax seen.

Upper abdomen: Limited images of the upper abdomen are unremarkable.

Musculoskeletal: Degenerative changes and ankylosis throughout the
kyphotic thoracic spine. Mild chronic compression deformity of the
T12 vertebral body. Additional degenerative changes at both
shoulders, right greater than left. No acute or suspicious osseous
finding. Superficial soft tissues are unremarkable.

Review of the MIP images confirms the above findings.
IMPRESSION: 1. No pulmonary embolism, with mild study limitations detailed
above.
2. Cardiomegaly.
3. Bibasilar edema suggesting CHF.
4. Moderate-sized pleural effusions bilaterally, right slightly
greater than left, with associated mild bibasilar atelectasis.
5. No evidence of pneumonia.
6. Aortic atherosclerosis.

## 2015-08-28 MED ORDER — HEPARIN SODIUM (PORCINE) 5000 UNIT/ML IJ SOLN
5000.0000 [IU] | Freq: Three times a day (TID) | INTRAMUSCULAR | Status: DC
  Administered 2015-08-28 – 2015-09-02 (×15): 5000 [IU] via SUBCUTANEOUS
  Filled 2015-08-28 (×16): qty 1

## 2015-08-28 MED ORDER — NITROGLYCERIN 0.4 MG SL SUBL: 0.4000 mg | SUBLINGUAL_TABLET | SUBLINGUAL | Status: DC | PRN

## 2015-08-28 MED ORDER — ASPIRIN EC 81 MG PO TBEC
81.0000 mg | DELAYED_RELEASE_TABLET | Freq: Every day | ORAL | Status: DC
  Administered 2015-08-29 – 2015-09-02 (×5): 81 mg via ORAL
  Filled 2015-08-28 (×5): qty 1

## 2015-08-28 MED ORDER — METOPROLOL TARTRATE 5 MG/5ML IV SOLN
5.0000 mg | Freq: Once | INTRAVENOUS | Status: AC
  Administered 2015-08-28: 5 mg via INTRAVENOUS
  Filled 2015-08-28: qty 5

## 2015-08-28 MED ORDER — FESOTERODINE FUMARATE ER 4 MG PO TB24
4.0000 mg | ORAL_TABLET | Freq: Every day | ORAL | Status: DC
  Administered 2015-08-28 – 2015-09-02 (×6): 4 mg via ORAL
  Filled 2015-08-28 (×6): qty 1

## 2015-08-28 MED ORDER — METOPROLOL TARTRATE 12.5 MG HALF TABLET
12.5000 mg | ORAL_TABLET | Freq: Once | ORAL | Status: AC
  Administered 2015-08-28: 12.5 mg via ORAL
  Filled 2015-08-28: qty 1

## 2015-08-28 MED ORDER — IOPAMIDOL (ISOVUE-370) INJECTION 76%
INTRAVENOUS | Status: AC
  Administered 2015-08-28: 80 mL
  Filled 2015-08-28: qty 100

## 2015-08-28 MED ORDER — DILTIAZEM HCL 25 MG/5ML IV SOLN
15.0000 mg | Freq: Once | INTRAVENOUS | Status: AC
  Administered 2015-08-28: 15 mg via INTRAVENOUS
  Filled 2015-08-28: qty 5

## 2015-08-28 MED ORDER — SODIUM CHLORIDE 0.9% FLUSH: 3.0000 mL | INTRAVENOUS | Status: DC | PRN

## 2015-08-28 MED ORDER — METOPROLOL TARTRATE 12.5 MG HALF TABLET
12.5000 mg | ORAL_TABLET | Freq: Two times a day (BID) | ORAL | Status: DC
  Administered 2015-08-28 (×2): 12.5 mg via ORAL
  Filled 2015-08-28 (×2): qty 1

## 2015-08-28 MED ORDER — DIGOXIN 0.25 MG/ML IJ SOLN
0.2500 mg | INTRAMUSCULAR | Status: AC
  Administered 2015-08-28 (×3): 0.25 mg via INTRAVENOUS
  Filled 2015-08-28 (×3): qty 2

## 2015-08-28 MED ORDER — ACETAMINOPHEN 325 MG PO TABS
650.0000 mg | ORAL_TABLET | ORAL | Status: DC | PRN
Start: 2015-08-28 — End: 2015-08-28

## 2015-08-28 MED ORDER — DILTIAZEM HCL 30 MG PO TABS
30.0000 mg | ORAL_TABLET | Freq: Four times a day (QID) | ORAL | Status: DC
  Administered 2015-08-28 – 2015-08-29 (×4): 30 mg via ORAL
  Filled 2015-08-28 (×4): qty 1

## 2015-08-28 MED ORDER — SODIUM CHLORIDE 0.9 % IV SOLN: 250.0000 mL | INTRAVENOUS | Status: DC | PRN

## 2015-08-28 MED ORDER — SODIUM CHLORIDE 0.9 % IV SOLN
INTRAVENOUS | Status: DC
  Administered 2015-08-28: 01:00:00 via INTRAVENOUS

## 2015-08-28 MED ORDER — CALCIUM CARBONATE ANTACID 500 MG PO CHEW
1.0000 | CHEWABLE_TABLET | Freq: Three times a day (TID) | ORAL | Status: DC
  Administered 2015-08-28 – 2015-09-02 (×14): 200 mg via ORAL
  Filled 2015-08-28 (×14): qty 1

## 2015-08-28 MED ORDER — ONDANSETRON HCL 4 MG/2ML IJ SOLN: 4.0000 mg | Freq: Four times a day (QID) | INTRAMUSCULAR | Status: DC | PRN

## 2015-08-28 MED ORDER — SODIUM CHLORIDE 0.9% FLUSH
3.0000 mL | Freq: Two times a day (BID) | INTRAVENOUS | Status: DC
  Administered 2015-08-28 – 2015-09-02 (×10): 3 mL via INTRAVENOUS

## 2015-08-28 MED ORDER — METOPROLOL TARTRATE 25 MG PO TABS
25.0000 mg | ORAL_TABLET | Freq: Two times a day (BID) | ORAL | Status: DC
  Administered 2015-08-28 – 2015-08-29 (×2): 25 mg via ORAL
  Filled 2015-08-28 (×2): qty 1

## 2015-08-28 MED ORDER — SODIUM CHLORIDE 0.9 % IV SOLN
250.0000 mL | INTRAVENOUS | Status: AC
  Administered 2015-08-28: 250 mL via INTRAVENOUS

## 2015-08-28 MED ORDER — FUROSEMIDE 10 MG/ML IJ SOLN
20.0000 mg | Freq: Once | INTRAMUSCULAR | Status: AC
  Administered 2015-08-28: 20 mg via INTRAVENOUS
  Filled 2015-08-28: qty 2

## 2015-08-28 MED ORDER — LEVOTHYROXINE SODIUM 25 MCG PO TABS
25.0000 ug | ORAL_TABLET | Freq: Every day | ORAL | Status: DC
  Administered 2015-08-28 – 2015-09-02 (×6): 25 ug via ORAL
  Filled 2015-08-28 (×7): qty 1

## 2015-08-28 MED ORDER — FUROSEMIDE 10 MG/ML IJ SOLN
20.0000 mg | Freq: Two times a day (BID) | INTRAMUSCULAR | Status: DC
  Administered 2015-08-28 – 2015-08-29 (×2): 20 mg via INTRAVENOUS
  Filled 2015-08-28 (×2): qty 2

## 2015-08-28 NOTE — Plan of Care (Signed)
Problem: Safety: Goal: Ability to remain free from injury will improve Outcome: Progressing Pt has a hx of falls. Pt understands to use the call bell if she needs to get up. Bed alarm placed.

## 2015-08-28 NOTE — Evaluation (Signed)
Clinical/Bedside Swallow Evaluation Patient Details  Name: [name] [name] ReasonerMarion McKeon Taylor [name] MRN: [id number] Date of Birth: 1923-11-24  Today's Date: 08/28/2015 Time: SLP Start Time (ACUTE ONLY): 1050 SLP Stop Time (ACUTE ONLY): 1111 SLP Time Calculation (min) (ACUTE ONLY): 21 min  Past Medical History: History reviewed. No pertinent past medical history. Past Surgical History: History reviewed. No pertinent surgical history. HPI:  82yo WF with a history of chronic atrial fibrillation not on chronic anticoagulation, diverticulosis with chronic fecal incontinence, GERD, anxiety, arthritis, ? Pancreatic tumor (per medical records from [name]) and hypotension, admitted with afib and RVR, chest pain, ?bibasilar pna, SOB.     Assessment / Plan / Recommendation Clinical Impression  Pt presents with normal oropharyngeal swallow with adequate mastication despite absence of teeth, brisk swallow response, and no overt s/s of aspiration.  Adequate reciprocity of swallow/respiratory function. Recommend continuing current diet, thin liquids.  No SLP f/u warranted.      Aspiration Risk  No limitations    Diet Recommendation   regular, thin liquids  Medication Administration: Whole meds with liquid    Other  Recommendations Oral Care Recommendations: Oral care BID   Follow up Recommendations  None    Frequency and Duration            Prognosis        Swallow Study   General HPI: 82yo WF with a history of chronic atrial fibrillation not on chronic anticoagulation, diverticulosis with chronic fecal incontinence, GERD, anxiety, arthritis, ? Pancreatic tumor (per medical records from [name]) and hypotension, admitted with afib and RVR, chest pain, ?bibasilar pna, SOB.   Type of Study: Bedside Swallow Evaluation Previous Swallow Assessment: none p er records Diet Prior to this Study: Regular;Thin liquids Temperature Spikes Noted: No Respiratory Status: Room air History of Recent Intubation:  No Behavior/Cognition: Alert;Cooperative Oral Cavity Assessment: Other (comment) (bruising anterior tongue and left lower lip pt states chroni) Oral Care Completed by SLP: No Oral Cavity - Dentition: Missing dentition Vision: Functional for self-feeding Self-Feeding Abilities: Able to feed self Patient Positioning: Upright in bed Baseline Vocal Quality: Normal Volitional Cough: Strong Volitional Swallow: Able to elicit    Oral/Motor/Sensory Function Overall Oral Motor/Sensory Function: Within functional limits   Ice Chips Ice chips: Within functional limits   Thin Liquid Thin Liquid: Within functional limits Presentation: Cup;Straw    Nectar Thick Nectar Thick Liquid: Not tested   Honey Thick Honey Thick Liquid: Not tested   Puree Puree: Within functional limits Presentation: Self Fed;Spoon   Solid   GO   Solid: Within functional limits       Marianny Goris L. Samson Fredericouture, [place] CCC/SLP Pager [phone]  Blenda MountsCouture, July Nickson Laurice 08/28/2015,11:12 AM

## 2015-08-28 NOTE — H&P (Signed)
History and Physical    [name] ReasonerMarion McKeon Taylor Klug VFI:[id number] DOB: 06/06/1923 DOA: 08/27/2015  PCP: Ignatius Speckinghruv B Vyas, MD   Patient coming from: Home, by way [facility]  Chief Complaint: Chest pain, bilateral leg weakness   HPI: [name] ReasonerMarion McKeon Taylor [name] is a 82 y.o. female with medical history significant for atrial fibrillation not on anticoagulation, hypothyroidism, hypertension, and overactive bladder who presents in [facility] where she had presented with chest pain and bilateral leg weakness. She reports intermittent transient episodes of bilateral leg "heaviness" and weakness going on for years and had such an episode yesterday while she was walking and had to stop and rest. She reportedly complained of chest pain throughout the day at the outside ER and was found to be in atrial fibrillation with RVR. Pain is described as localized to the central chest, moderate in intensity, constant, and with no alleviating or exacerbating factors identified. There is no associated nausea or diaphoresis. There is no dyspnea, but the patient has developed a nonproductive cough.  [facility] ED Course: [facility], patient is found to be afebrile, saturating well on room air, with heart rate in the 140s, and vitals otherwise stable. EKG demonstrated an atrial fibrillation/flutter with RVR and chest x-ray featured patchy bibasilar densities consistent with atelectasis versus pneumonia. Chemistry panel features a hyponatremia to 127 with concomitant hypochloremia to 90. LFTs are within the normal limits. CBC is unremarkable and troponin was undetectable. Patient was given a 324 mg aspirin at the outside hospital and started on diltiazem infusion. Heart rate normalized and she was taken off the diltiazem. There was some concern at the outside hospital that her leg weakness had represented syncope though the patient adamantly denies any associated lightheadedness or loss of  consciousness. ED physician discussed the case with cardiology and the patient was accepted in [facility] . Cardiologist has evaluated the patient here at [facility] on the telemetry unit, feels that the primary problems are noncardiac, and requests a medical admission with cardiology to consult.   Review of Systems:  All other systems reviewed and apart from HPI, are negative.  History reviewed. No pertinent past medical history.  History reviewed. No pertinent surgical history.   has no tobacco, alcohol, and drug history on file.  Allergies not on file  History reviewed. No pertinent family history.   Prior to Admission medications   Not on File    Physical Exam: Vitals:   08/27/15 2246  BP: 113/67  Pulse: 73  Resp: 17  Temp: 97.9 F (36.6 C)  TempSrc: Oral  SpO2: 100%  Weight: 69.4 kg (152 lb 14.4 oz)  Height: 5\' 1"  (1.549 m)      Constitutional: NAD, calm, comfortable Eyes: PERTLA, lids and conjunctivae normal ENMT: Mucous membranes are moist. Posterior pharynx clear of any exudate or lesions.   Neck: normal, supple, no masses, no thyromegaly Respiratory: Right basilar rhonchi. Normal respiratory effort. No accessory muscle use.  Cardiovascular: Rate ~120 and irregular. PMI not displaced. No significant JVD. Abdomen: No distension, no tenderness, no masses palpated. Bowel sounds normal.  Musculoskeletal: no clubbing / cyanosis. No joint deformity upper and lower extremities. Normal muscle tone.  Skin: no significant rashes, lesions, ulcers. Warm, dry, well-perfused. Neurologic: CN 2-12 grossly intact. Sensation intact, DTR normal. Strength 5/5 in all 4 limbs.  Psychiatric: Normal judgment and insight. Alert and oriented x 3. Normal mood and affect.     Labs on Admission:  I have personally reviewed following labs and imaging studies  CBC: No results for input(s): WBC, NEUTROABS, HGB, HCT, MCV, PLT in the last 168 hours. Basic Metabolic  Panel: No results for input(s): NA, K, CL, CO2, GLUCOSE, BUN, CREATININE, CALCIUM, MG, PHOS in the last 168 hours. GFR: CrCl cannot be calculated (No order found.). Liver Function Tests: No results for input(s): AST, ALT, ALKPHOS, BILITOT, PROT, ALBUMIN in the last 168 hours. No results for input(s): LIPASE, AMYLASE in the last 168 hours. No results for input(s): AMMONIA in the last 168 hours. Coagulation Profile: No results for input(s): INR, PROTIME in the last 168 hours. Cardiac Enzymes:  Recent Labs Lab 08/28/15 0015  TROPONINI 0.03*   BNP (last 3 results) No results for input(s): PROBNP in the last 8760 hours. HbA1C: No results for input(s): HGBA1C in the last 72 hours. CBG: No results for input(s): GLUCAP in the last 168 hours. Lipid Profile: No results for input(s): CHOL, HDL, LDLCALC, TRIG, CHOLHDL, LDLDIRECT in the last 72 hours. Thyroid Function Tests: No results for input(s): TSH, T4TOTAL, FREET4, T3FREE, THYROIDAB in the last 72 hours. Anemia Panel: No results for input(s): VITAMINB12, FOLATE, FERRITIN, TIBC, IRON, RETICCTPCT in the last 72 hours. Urine analysis: No results found for: COLORURINE, APPEARANCEUR, LABSPEC, PHURINE, GLUCOSEU, HGBUR, BILIRUBINUR, KETONESUR, PROTEINUR, UROBILINOGEN, NITRITE, LEUKOCYTESUR Sepsis Labs: @LABRCNTIP (procalcitonin:4,lacticidven:4) ) Recent Results (from the past 240 hour(s))  MRSA PCR Screening     Status: None   Collection Time: 08/27/15 10:35 PM  Result Value Ref Range Status   MRSA by PCR NEGATIVE NEGATIVE Final    Comment:        The GeneXpert MRSA Assay (FDA approved for NASAL specimens only), is one component of a comprehensive MRSA colonization surveillance program. It is not intended to diagnose MRSA infection nor to guide or monitor treatment for MRSA infections.      Radiological Exams on Admission: No results found.  EKG: Independently reviewed. Atrial fib/flutter with RVR    Assessment/Plan  1.  Atrial fibrillation with RVR  - Cardiology consulting and much appreciated  - Was on diltiazem infusion at outside hospital, HR normalized, and she was taken off  - Using Lopressor at home  - Uncertain precipitant for RVR; T4 and TSH both barely elevated; possible PNA on CXR though does not really correlate clinically  - CHADS-VASc at least 54 (age x2, gender, HTN)  - Not on [facility], presumably d/t falls  - Monitoring on telemetry  - Ongoing mgmt per cardiology    2. Chest pain  - Cardiology consulting and much appreciated  - Troponin at outside hospital <0.01  - Given ASA 324 at outside hospital  - Monitoring on telemetry, cycling troponin  - TTE ordered   - Ongoing mgmt per cardiology   3. Leg weakness, intermittent  - Uncertain etiology, likely multifactorial  - Hyponatremia may be contributing and will be managed as below  - No back pain or numbness/tingling to suggest a cord-compression or radiculopathy - PT eval requested    4. Hyponatremia  - Serum sodium 127 at outside hospital  - Difficult to assess fluid status d/t pt body habitus; suspect she is a little volume-up  - Check urine studies, osm  - SLIV for now and follow daily BMP  - CXR, BNP, and UA pending; may help to clarify fluid-status    5. Hypothyroidism  - Recently started on Synthroid 25 mcg - TSH and free T4 at outside hospital are both minimally elevated  - Continue  current-dose Synthroid for now, though would be reasonable to hold in setting of a fib RVR without clear precipitant   6. Cough  - CXR at outside hospital reported as bibasilar opacities, atelectasis vs PNA  - There is no fever or leukocytosis and cough is not productive  - Unable to load outside films, will repeat CXR here and check PCT to help determine management  - Saturating well on rm air currently with unlabored breathing    DVT prophylaxis: sq heparin  Code Status: Full   Family Communication: Discussed with patient Disposition Plan:  Admit to telemetry  Consults called: Cardiology Admission status: Inpatient    Briscoe Deutscherimothy S Oluwaseun Cremer, MD [facility] Pager [phone]  If 7PM-7AM, please contact night-coverage [email or web address] Password [email or web address]  08/28/2015, 1:10 AM

## 2015-08-28 NOTE — Evaluation (Signed)
Physical Therapy Evaluation Patient Details Name: [name] ReasonerMarion McKeon Taylor [name] MRN: [id number] DOB: 11/26/1923 Today's Date: 08/28/2015   History of Present Illness  82 y.o. F presented with chest pain and bilateral leg weakness on 8/16. EKG revealed A Fib/flutter and RVR. Chest x-ray showed atelectasis.  PMHx: Afib, hypotension.  Clinical Impression  Patient demonstrates deficits in functional mobility as indicated below. Will need continued skilled PT to address deficits and maixmize function. Will see as indicated and progress as tolerated.     Follow Up Recommendations Home health PT;Supervision/Assistance - 24 hour;Other (comment) (Home health Aide)    Equipment Recommendations  Other (comment) (TBD)    Recommendations for Other Services       Precautions / Restrictions Precautions Precautions: Fall      Mobility  Bed Mobility Overal bed mobility: Needs Assistance Bed Mobility: Supine to Sit     Supine to sit: Min assist     General bed mobility comments: increased time to perform min assist to come to EOB. increased effort noted  Transfers Overall transfer level: Needs assistance Equipment used: None Transfers: Sit to/from [employer]Stand;Stand Pivot Transfers Sit to Stand: Min guard Stand pivot transfers: Min guard       General transfer comment: Min guard for safety increased time to perform  Ambulation/Gait Ambulation/Gait assistance: Min assist Ambulation Distance (Feet): 16 Feet Assistive device: 1 person hand held assist Gait Pattern/deviations: Step-through pattern;Decreased stride length;Drifts right/left;Narrow base of support Gait velocity: decreased Gait velocity interpretation: Below normal speed for age/gender General Gait Details: modest instability noted limited ambulation due to elevation in HR upper 150s  Stairs            Wheelchair Mobility    Modified Rankin (Stroke Patients Only)       Balance Overall balance assessment: Needs  assistance   Sitting balance-Leahy Scale: Fair       Standing balance-Leahy Scale: Poor Standing balance comment: HHA required at this time                             Pertinent Vitals/Pain Pain Assessment: No/denies pain    Home Living Family/patient expects to be discharged to:: Private residence Living Arrangements: Spouse/significant other Available Help at Discharge: Family Type of Home: House Home Access: Level entry     Home Layout: One level (one step into kitchen)        Prior Function Level of Independence: Independent         Comments: drives     Hand Dominance   Dominant Hand: Right    Extremity/Trunk Assessment   Upper Extremity Assessment: Generalized weakness           Lower Extremity Assessment: Generalized weakness         Communication   Communication: No difficulties  Cognition Arousal/Alertness: Awake/alert Behavior During Therapy: WFL for tasks assessed/performed Overall Cognitive Status: Within Functional Limits for tasks assessed                      General Comments      Exercises        Assessment/Plan    PT Assessment Patient needs continued PT services  PT Diagnosis Difficulty walking;Abnormality of gait;Generalized weakness   PT Problem List Decreased strength;Decreased activity tolerance;Decreased balance;Decreased mobility;Cardiopulmonary status limiting activity  PT Treatment Interventions DME instruction;Gait training;Functional mobility training;Therapeutic activities;Therapeutic exercise;Balance training;Patient/family education   PT Goals (Current goals can be found in the Care Plan  section) Acute Rehab PT Goals Patient Stated Goal: to go home PT Goal Formulation: With patient Time For Goal Achievement: 09/11/15 Potential to Achieve Goals: Good    Frequency Min 3X/week   Barriers to discharge        Co-evaluation               End of Session Equipment Utilized During  Treatment: Gait belt;Oxygen Activity Tolerance: Patient tolerated treatment well;Treatment limited secondary to medical complications (Comment) (elevated HR upper 150s nsg aware) Patient left: in chair;with call bell/phone within reach;with chair alarm set Nurse Communication: Mobility status         Time: 1720-1738 PT Time Calculation (min) (ACUTE ONLY): 18 min   Charges:   PT Evaluation $PT Eval Moderate Complexity: 1 Procedure     PT G CodesFabio [name]:        [name] [name] 08/28/2015, 5:58 PM  [name] [name], PT DPT  [phone]

## 2015-08-28 NOTE — Plan of Care (Signed)
Problem: Pain Managment: Goal: General experience of comfort will improve Outcome: Completed/Met Date Met: 08/28/15 Pt not currently experiencing any chest discomfort. Understands pain rating scale.

## 2015-08-28 NOTE — Progress Notes (Signed)
*  PRELIMINARY RESULTS* Echocardiogram 2D Echocardiogram has been performed.  [name] [name], [name] [name] 08/28/2015, 3:52 PM

## 2015-08-28 NOTE — Progress Notes (Signed)
SUBJECTIVE:  Still SOB  OBJECTIVE:   Vitals:   Vitals:   08/28/15 0723 08/28/15 0752 08/28/15 0839 08/28/15 1242  BP:  128/81 117/90 (!) 137/99  Pulse: (!) 132  (!) 134 (!) 104  Resp: 20  20 (!) 25  Temp:   97.5 F (36.4 C) 97.8 F (36.6 C)  TempSrc:   Oral Oral  SpO2: 97%  98% 91%  Weight:      Height:       I&O's:   Intake/Output Summary (Last 24 hours) at 08/28/15 1502 Last data filed at 08/28/15 1243  Gross per 24 hour  Intake              480 ml  Output              500 ml  Net              -20 ml   TELEMETRY: Reviewed telemetry pt in atrial fibrillation with RVR:     PHYSICAL EXAM General: Well developed, well nourished, in no acute distress Head: Eyes PERRLA, No xanthomas.   Normal cephalic and atramatic  Lungs:   Crackles at bases bilaterally Heart:   Irregularly irregular and tachy S1 S2 Pulses are 2+ & equal. Abdomen: Bowel sounds are positive, abdomen soft and non-tender without masses  Msk:  Back normal, normal gait. Normal strength and tone for age. Extremities:   No clubbing, cyanosis or edema.  DP +1 Neuro: Alert and oriented X 3. Psych:  Good affect, responds appropriately   LABS: Basic Metabolic Panel:  Recent Labs  16/10/96 0148  NA 128*  K 4.5  CL 99*  CO2 26  GLUCOSE 123*  BUN 11  CREATININE 1.20*  CALCIUM 8.2*  MG 2.0   Liver Function Tests:  Recent Labs  08/28/15 0148  AST 30  ALT 27  ALKPHOS 64  BILITOT 0.6  PROT 5.6*  ALBUMIN 3.1*   No results for input(s): LIPASE, AMYLASE in the last 72 hours. CBC:  Recent Labs  08/28/15 0148  WBC 6.7  NEUTROABS 5.1  HGB 11.4*  HCT 34.1*  MCV 93.9  PLT 193   Cardiac Enzymes:  Recent Labs  08/28/15 0015 08/28/15 0532 08/28/15 1301  TROPONINI 0.03* <0.03 <0.03   BNP: Invalid input(s): POCBNP D-Dimer: No results for input(s): DDIMER in the last 72 hours. Hemoglobin A1C: No results for input(s): HGBA1C in the last 72 hours. Fasting Lipid Panel: No results for  input(s): CHOL, HDL, LDLCALC, TRIG, CHOLHDL, LDLDIRECT in the last 72 hours. Thyroid Function Tests:  Recent Labs  08/28/15 0148  TSH 4.255   Anemia Panel: No results for input(s): VITAMINB12, FOLATE, FERRITIN, TIBC, IRON, RETICCTPCT in the last 72 hours. Coag Panel:   Lab Results  Component Value Date   INR 1.27 08/28/2015    RADIOLOGY: Ct Angio Chest Pe W Or Wo Contrast  Result Date: 08/28/2015 CLINICAL DATA:  Shortness of breath for few days, episodes chest pain. EXAM: CT ANGIOGRAPHY CHEST WITH CONTRAST TECHNIQUE: Multidetector CT imaging of the chest was performed using the standard protocol during bolus administration of intravenous contrast. Multiplanar CT image reconstructions and MIPs were obtained to evaluate the vascular anatomy. CONTRAST:  80 cc Isovue 370 COMPARISON:  Chest x-ray from earlier same day and chest CT dated 02/12/2011. FINDINGS: Mediastinum/Lymph Nodes: Some of the most peripheral segmental and subsegmental pulmonary arteries are difficult to definitively characterize due to patient breathing motion artifact but there is no pulmonary embolism identified within the  main, lobar or central segmental pulmonary arteries bilaterally. Atherosclerotic changes along the walls of the normal-caliber thoracic aorta. No aortic aneurysm. Heart is enlarged. No pericardial effusion. No mass or enlarged lymph nodes seen within the mediastinum or perihilar regions. Esophagus is unremarkable. Trachea appears normal. Lungs/Pleura: Interstitial and peribronchial thickening at the lung bases suggesting edema. Bilateral pleural effusions, moderate in size, right slightly greater than left. Associated mild bibasilar atelectasis. No evidence of pneumonia. No pneumothorax seen. Upper abdomen: Limited images of the upper abdomen are unremarkable. Musculoskeletal: Degenerative changes and ankylosis throughout the kyphotic thoracic spine. Mild chronic compression deformity of the T12 vertebral body.  Additional degenerative changes at both shoulders, right greater than left. No acute or suspicious osseous finding. Superficial soft tissues are unremarkable. Review of the MIP images confirms the above findings. IMPRESSION: 1. No pulmonary embolism, with mild study limitations detailed above. 2. Cardiomegaly. 3. Bibasilar edema suggesting CHF. 4. Moderate-sized pleural effusions bilaterally, right slightly greater than left, with associated mild bibasilar atelectasis. 5. No evidence of pneumonia. 6. Aortic atherosclerosis. Electronically Signed   By: Bary RichardStan  Maynard M.D.   On: 08/28/2015 12:12   Dg Chest Port 1 View  Result Date: 08/28/2015 CLINICAL DATA:  Cough and chest pain EXAM: PORTABLE CHEST 1 VIEW COMPARISON:  08/27/2015 FINDINGS: Cardiac shadow is again mildly enlarged. Aortic calcifications are again seen. The lungs are well aerated with slight increasing infiltrate at the bases particularly on the right. No bony abnormality is seen. IMPRESSION: Increasing bibasilar changes particularly on the right. Electronically Signed   By: Alcide CleverMark  Lukens M.D.   On: 08/28/2015 09:21    ASSESSMENT/PLAN: 1.  Chronic atrial flutter/atrial fibrillation now presenting with RVR. Unsure what triggered the rapid heart rate.  Her HR on arrival last night was in the 50-60's off Cardizem gtt but then went back into the 140's.  Given a dose of IV Dig due to soft BP but HR not affected.  Did not tolerate IV Cardizem due to low Bp but tolerating PO CCB.  She has not been on chronic anticoagulation in the past and I suspect due to frequent falls and advanced age.  Her CHADS2VASC score is 4.  Her Free T4 is elevated along with her TSH but only mildly.  Her HR is in the 110-130bpm range.  Increase metoprolol to 25mg  BID and will give an extra 12.5mg  now.  Continue PO CCB and titrate as BP tolerates.  If BP limits uptitration of rate controlling meds, may need to start IV Amio. Would avoid starting daily dig given her advanced age  and reduced kidney function.  2.  Chest pain that is somewhat atypical and worse with laying flat.  She does have a chest xray with ? Bibasilar PNA.  She has never smoked and really has no cardiac risk factors other than advanced age.  She has a LBBB on EKG with no old EKG to compare to.  Given her advanced age would pursue a more conservative approach.  Will check a 2D echo to assess LVF. Trop neg x 3.  3.  SOB of several days duration with cough but no fever or chills.  CXR with ? Bibasilar PNA.  BNP elevated consistent with acute CHF.  Will check 2D echo to assess LVF.  She was in aflutter with RVR on arrival so this could have been etiology of SOB with CHF although CXR not consistent with that.   Also unknown duration of aflutter with RVR so could have tachycardia induced DCM.  Will start gentle diuresis as BP tolerates.   4.  Leg heaviness and weakness with frequent falls of unknown etiology.  She says that she has not passed out although she has a history of neurocardiogenic syncope on her PMH list.    5.  Hyponatremia may be due to acute CHF.  Will follow with diuresis.     Armanda Magicraci Trayven Lumadue, MD  08/28/2015  3:02 PM

## 2015-08-28 NOTE — Progress Notes (Signed)
Patient seen and examined  82yo WF with a history of chronic atrial fibrillation not on chronic anticoagulation (patient does not know why but most likely due to frequent falls and advanced age), diverticulosis with chronic fecal incontinence, GERD, anxiety, arthritis, ? Pancreatic tumor (per medical records from [facility]) and hypotension.  She was found to be in atrial flutter with RVR and was given Cardizem bolus of 50mg  total and started on a gtt.  Assessment and plan 1. Atrial fibrillation with RVR , heart rate continues to be in the 130s - Cardiology consulted, troponin 0.03,<0.03 - Was on diltiazem infusion at outside hospital, HR up this morning, started on by mouth Cardizem , after IV push - Using Lopressor  Iv prn  - Uncertain precipitant for RVR; T4 and TSH both barely elevated;  Doubt pneumonia as pro-calcitonin negative - CHADS-VASc at least 494 (age x2, gender, HTN)  - [facility]   Appreciate cardiology input, started oral Cardizem 30 mg every 6  CTA chest to rule out PE  2. Chest pain  - Cardiology consulting and much appreciated ,somewhat atypical and worse with laying flat.  She does have a chest xray with ? Bibasilar PNA - Troponins  remained negative - Given ASA 324 at outside hospital  - Monitoring on telemetry, cycling troponin  - TTE ordered   - Ongoing mgmt per cardiology   3. Leg weakness, intermittent  - Uncertain etiology, likely multifactorial  - Hyponatremia may be contributing and will be managed as below  - No back pain or numbness/tingling to suggest a cord-compression or radiculopathy - PT eval requested    4. Hyponatremia  - Serum sodium 127 at outside hospital , now 128 - Difficult to assess fluid status d/t pt body habitus; suspect she is a little volume-up  - Check urine studies, osm  - SLIV for now and follow daily BMP  - CXR, BNP 768.9,  could be secondary to  congestive heart failure, 2-D echo pending   5.  Hypothyroidism  - Recently started on Synthroid 25 mcg - TSH and free T4 at outside hospital are both minimally elevated  - Continue current-dose Synthroid for now, though would be reasonable to hold in setting of a fib RVR without clear precipitant   6. Cough  - CXR at outside hospital reported as bibasilar opacities, atelectasis vs PNA  - There is no fever or leukocytosis and cough is not productive  - Unable to load outside films, will repeat CXR here and pro-calcitonin negative, doubt pneumonia

## 2015-08-29 ENCOUNTER — Encounter (HOSPITAL_COMMUNITY): Payer: Self-pay | Admitting: *Deleted

## 2015-08-29 DIAGNOSIS — R29898 Other symptoms and signs involving the musculoskeletal system: Secondary | ICD-10-CM

## 2015-08-29 DIAGNOSIS — I5021 Acute systolic (congestive) heart failure: Secondary | ICD-10-CM

## 2015-08-29 DIAGNOSIS — I420 Dilated cardiomyopathy: Secondary | ICD-10-CM

## 2015-08-29 LAB — CBC
HCT: 35 % — ABNORMAL LOW (ref 36.0–46.0)
Hemoglobin: 11.6 g/dL — ABNORMAL LOW (ref 12.0–15.0)
MCH: 31.1 pg (ref 26.0–34.0)
MCHC: 33.1 g/dL (ref 30.0–36.0)
MCV: 93.8 fL (ref 78.0–100.0)
PLATELETS: 191 10*3/uL (ref 150–400)
RBC: 3.73 MIL/uL — ABNORMAL LOW (ref 3.87–5.11)
RDW: 12 % (ref 11.5–15.5)
WBC: 7.8 10*3/uL (ref 4.0–10.5)

## 2015-08-29 LAB — COMPREHENSIVE METABOLIC PANEL
ALK PHOS: 71 U/L (ref 38–126)
ALT: 30 U/L (ref 14–54)
AST: 29 U/L (ref 15–41)
Albumin: 3.1 g/dL — ABNORMAL LOW (ref 3.5–5.0)
Anion gap: 10 (ref 5–15)
BILIRUBIN TOTAL: 0.6 mg/dL (ref 0.3–1.2)
BUN: 12 mg/dL (ref 6–20)
CO2: 23 mmol/L (ref 22–32)
CREATININE: 1.1 mg/dL — AB (ref 0.44–1.00)
Calcium: 8.6 mg/dL — ABNORMAL LOW (ref 8.9–10.3)
Chloride: 95 mmol/L — ABNORMAL LOW (ref 101–111)
GFR calc Af Amer: 50 mL/min — ABNORMAL LOW (ref >60)
GFR, EST NON AFRICAN AMERICAN: 43 mL/min — AB (ref >60)
Glucose, Bld: 99 mg/dL (ref 65–99)
Potassium: 4.4 mmol/L (ref 3.5–5.1)
Sodium: 128 mmol/L — ABNORMAL LOW (ref 135–145)
TOTAL PROTEIN: 6.1 g/dL — AB (ref 6.5–8.1)

## 2015-08-29 MED ORDER — FUROSEMIDE 10 MG/ML IJ SOLN: 40.0000 mg | Freq: Two times a day (BID) | INTRAMUSCULAR | Status: DC

## 2015-08-29 MED ORDER — AMIODARONE LOAD VIA INFUSION: 150.0000 mg | Freq: Once | INTRAVENOUS | Status: DC

## 2015-08-29 MED ORDER — FUROSEMIDE 10 MG/ML IJ SOLN
40.0000 mg | Freq: Two times a day (BID) | INTRAMUSCULAR | Status: DC
  Administered 2015-08-29 – 2015-09-01 (×7): 40 mg via INTRAVENOUS
  Filled 2015-08-29 (×7): qty 4

## 2015-08-29 MED ORDER — METOPROLOL TARTRATE 25 MG PO TABS
25.0000 mg | ORAL_TABLET | Freq: Three times a day (TID) | ORAL | Status: DC
  Administered 2015-08-29 – 2015-08-30 (×3): 25 mg via ORAL
  Filled 2015-08-29 (×3): qty 1

## 2015-08-29 MED ORDER — AMIODARONE HCL IN DEXTROSE 360-4.14 MG/200ML-% IV SOLN: 60.0000 mg/h | INTRAVENOUS | Status: DC

## 2015-08-29 MED ORDER — AMIODARONE HCL IN DEXTROSE 360-4.14 MG/200ML-% IV SOLN: 30.0000 mg/h | INTRAVENOUS | Status: DC

## 2015-08-29 NOTE — Progress Notes (Signed)
[facility] PROGRESS NOTE  [name] [name] ZOX:[id number] DOB: 1923/03/04 DOA: 08/27/2015   PCP: Ignatius Specking, MD     Assessment/Plan: Principal Problem:   Chest pain Active Problems:   Atrial fibrillation with RVR (HCC)   Cough   Hypothyroidism   Hyponatremia   Bilateral leg weakness   Chest pain at rest   SOB (shortness of breath)    82yo WF with a history of chronic atrial fibrillation not on chronic anticoagulation (patient does not know why but most likely due to frequent falls and advanced age), diverticulosis with chronic fecal incontinence, GERD, anxiety, arthritis, ? Pancreatic tumor (per medical records from [facility]) and hypotension.She was found to be in atrial flutter with RVR and was given Cardizem and started on a drip  Assessment and plan  Atrial fibrillation with RVR , heart rate continues to be in the 130s - Cardiology consulted, troponin 0.03,<0.03 - Was on diltiazem infusion at outside hospital, HR up again since 8/17, started on by mouth Cardizem , after IV push with minimal effect She has not been on chronic anticoagulation in the past   due to frequent falls and advanced age. Her CHADS2VASC score is 4.  Her Free T4 is elevated along with her TSH but only mildly.  Her HR is in the 110-130bpm range.  Currently on metoprolol to 25mg  BID  Doubt pneumonia as pro-calcitonin negative - CHADS-VASc at least 35 (age x2, gender, HTN)  - Not on AC, presumably d/t falls  CTA  no PE, cardiomegaly, moderate-sized bilateral pleural effusions, no pneumonia stop CCB due to LV dysfunction and increase BB as BP tolerates Increase metoprolol to 25mg  TID.  Ultimately may need Amio to control HR.  Would avoid starting daily dig given her advanced age and reduced kidney function   Chest pain  No evidence of pneumonia, has bilateral pleural effusions - Troponins  remained negative, 2-D echo shows EF of 25-30%, diffuse hypokinesis, may need a cardiac cath,  deferred to cardiology - Given ASA 324 at outside hospital , currently on aspirin 81 mg a day   Acute on chronic systolic congestive heart failure-tachycardia mediated Currently on Lasix will increase dose if blood pressure tolerates Increase lasix to 40mg  IV BID and follow I&O's and daily weights closely.  Renal function stable    Leg weakness, likely secondary to all of the above - Uncertain etiology, likely multifactorial  - Hyponatremia may be contributing and will be managed as below  - No back pain or numbness/tingling to suggest a cord-compression or radiculopathy - PT eval  when patient is medically stable    Hyponatremia  - Serum sodium 127 at outside hospital , now 128 , likely secondary to volume overload, hopefully should improve with diuresis - SLIV for now and follow daily BMP  - CXR, BNP 768.9,  could be secondary to  congestive heart failure,      Hypothyroidism  - Recently started on Synthroid 25 mcg - TSH and free T4 at outside hospital are both minimally elevated  - Continue current-dose Synthroid for now, though would be reasonable to hold in setting of a fib RVR without clear precipitant    Cough  - CXR at outside hospital reported as bibasilar opacities, atelectasis vs PNA  - There is no fever or leukocytosis and cough is not productive  - Unable to load outside films, will repeat CXR here and pro-calcitonin negative, doubt pneumonia     DVT prophylaxsis heparin  Code Status:  Full code     Family Communication: Discussed in detail with the patient, all imaging results, lab results explained to the patient   Disposition Plan: Disposition as per cardiology      Consultants:  Cardiology  Procedures:  None  Antibiotics: Anti-infectives    None         HPI/Subjective: Continues to be tachycardic and short of breath  Objective: Vitals:   08/28/15 1709 08/28/15 2005 08/29/15 0000 08/29/15 0419  BP: (!) 140/92 (!) 137/95 105/74  (!) 111/94  Pulse: (!) 139 (!) 140 (!) 136 (!) 136  Resp:  18 14 19   Temp: 98.5 F (36.9 C) 98.3 F (36.8 C) 98.7 F (37.1 C) 98.1 F (36.7 C)  TempSrc: Oral Oral Oral Oral  SpO2: 99% 93% 92% 95%  Weight:    68.9 kg (152 lb)  Height:        Intake/Output Summary (Last 24 hours) at 08/29/15 0848 Last data filed at 08/29/15 0817  Gross per 24 hour  Intake              360 ml  Output              700 ml  Net             -340 ml    Exam:  Examination:  General exam: Appears calm and comfortable  Respiratory system: Clear to auscultation. Respiratory effort normal. Cardiovascular system: S1 & S2 heard, RRR. No JVD, murmurs, rubs, gallops or clicks. No pedal edema. Gastrointestinal system: Abdomen is nondistended, soft and nontender. No organomegaly or masses felt. Normal bowel sounds heard. Central nervous system: Alert and oriented. No focal neurological deficits. Extremities: Symmetric 5 x 5 power. Skin: No rashes, lesions or ulcers Psychiatry: Judgement and insight appear normal. Mood & affect appropriate.     Data Reviewed: I have personally reviewed following labs and imaging studies  Micro Results Recent Results (from the past 240 hour(s))  MRSA PCR Screening     Status: None   Collection Time: 08/27/15 10:35 PM  Result Value Ref Range Status   MRSA by PCR NEGATIVE NEGATIVE Final    Comment:        The GeneXpert MRSA Assay (FDA approved for NASAL specimens only), is one component of a comprehensive MRSA colonization surveillance program. It is not intended to diagnose MRSA infection nor to guide or monitor treatment for MRSA infections.     Radiology Reports Ct Angio Chest Pe W Or Wo Contrast  Result Date: 08/28/2015 CLINICAL DATA:  Shortness of breath for few days, episodes chest pain. EXAM: CT ANGIOGRAPHY CHEST WITH CONTRAST TECHNIQUE: Multidetector CT imaging of the chest was performed using the standard protocol during bolus administration of  intravenous contrast. Multiplanar CT image reconstructions and MIPs were obtained to evaluate the vascular anatomy. CONTRAST:  80 cc Isovue 370 COMPARISON:  Chest x-ray from earlier same day and chest CT dated 02/12/2011. FINDINGS: Mediastinum/Lymph Nodes: Some of the most peripheral segmental and subsegmental pulmonary arteries are difficult to definitively characterize due to patient breathing motion artifact but there is no pulmonary embolism identified within the main, lobar or central segmental pulmonary arteries bilaterally. Atherosclerotic changes along the walls of the normal-caliber thoracic aorta. No aortic aneurysm. Heart is enlarged. No pericardial effusion. No mass or enlarged lymph nodes seen within the mediastinum or perihilar regions. Esophagus is unremarkable. Trachea appears normal. Lungs/Pleura: Interstitial and peribronchial thickening at the lung bases suggesting edema. Bilateral pleural effusions, moderate  in size, right slightly greater than left. Associated mild bibasilar atelectasis. No evidence of pneumonia. No pneumothorax seen. Upper abdomen: Limited images of the upper abdomen are unremarkable. Musculoskeletal: Degenerative changes and ankylosis throughout the kyphotic thoracic spine. Mild chronic compression deformity of the T12 vertebral body. Additional degenerative changes at both shoulders, right greater than left. No acute or suspicious osseous finding. Superficial soft tissues are unremarkable. Review of the MIP images confirms the above findings. IMPRESSION: 1. No pulmonary embolism, with mild study limitations detailed above. 2. Cardiomegaly. 3. Bibasilar edema suggesting CHF. 4. Moderate-sized pleural effusions bilaterally, right slightly greater than left, with associated mild bibasilar atelectasis. 5. No evidence of pneumonia. 6. Aortic atherosclerosis. Electronically Signed   By: Bary Richard M.D.   On: 08/28/2015 12:12   Dg Chest Port 1 View  Result Date:  08/28/2015 CLINICAL DATA:  Cough and chest pain EXAM: PORTABLE CHEST 1 VIEW COMPARISON:  08/27/2015 FINDINGS: Cardiac shadow is again mildly enlarged. Aortic calcifications are again seen. The lungs are well aerated with slight increasing infiltrate at the bases particularly on the right. No bony abnormality is seen. IMPRESSION: Increasing bibasilar changes particularly on the right. Electronically Signed   By: Alcide Clever M.D.   On: 08/28/2015 09:21     CBC  Recent Labs Lab 08/28/15 0148 08/29/15 0500  WBC 6.7 7.8  HGB 11.4* 11.6*  HCT 34.1* 35.0*  PLT 193 191  MCV 93.9 93.8  MCH 31.4 31.1  MCHC 33.4 33.1  RDW 11.9 12.0  LYMPHSABS 1.2  --   MONOABS 0.5  --   EOSABS 0.0  --   BASOSABS 0.0  --     Chemistries   Recent Labs Lab 08/28/15 0148 08/29/15 0500  NA 128* 128*  K 4.5 4.4  CL 99* 95*  CO2 26 23  GLUCOSE 123* 99  BUN 11 12  CREATININE 1.20* 1.10*  CALCIUM 8.2* 8.6*  MG 2.0  --   AST 30 29  ALT 27 30  ALKPHOS 64 71  BILITOT 0.6 0.6   ------------------------------------------------------------------------------------------------------------------ estimated creatinine clearance is 30.2 mL/min (by C-G formula based on SCr of 1.1 mg/dL). ------------------------------------------------------------------------------------------------------------------ No results for input(s): HGBA1C in the last 72 hours. ------------------------------------------------------------------------------------------------------------------ No results for input(s): CHOL, HDL, LDLCALC, TRIG, CHOLHDL, LDLDIRECT in the last 72 hours. ------------------------------------------------------------------------------------------------------------------  Recent Labs  08/28/15 0148  TSH 4.255   ------------------------------------------------------------------------------------------------------------------ No results for input(s): VITAMINB12, FOLATE, FERRITIN, TIBC, IRON, RETICCTPCT in the  last 72 hours.  Coagulation profile  Recent Labs Lab 08/28/15 0148  INR 1.27    No results for input(s): DDIMER in the last 72 hours.  Cardiac Enzymes  Recent Labs Lab 08/28/15 0015 08/28/15 0532 08/28/15 1301  TROPONINI 0.03* <0.03 <0.03   ------------------------------------------------------------------------------------------------------------------ Invalid input(s): POCBNP   CBG: No results for input(s): GLUCAP in the last 168 hours.     Studies: Ct Angio Chest Pe W Or Wo Contrast  Result Date: 08/28/2015 CLINICAL DATA:  Shortness of breath for few days, episodes chest pain. EXAM: CT ANGIOGRAPHY CHEST WITH CONTRAST TECHNIQUE: Multidetector CT imaging of the chest was performed using the standard protocol during bolus administration of intravenous contrast. Multiplanar CT image reconstructions and MIPs were obtained to evaluate the vascular anatomy. CONTRAST:  80 cc Isovue 370 COMPARISON:  Chest x-ray from earlier same day and chest CT dated 02/12/2011. FINDINGS: Mediastinum/Lymph Nodes: Some of the most peripheral segmental and subsegmental pulmonary arteries are difficult to definitively characterize due to patient breathing motion artifact but there is no  pulmonary embolism identified within the main, lobar or central segmental pulmonary arteries bilaterally. Atherosclerotic changes along the walls of the normal-caliber thoracic aorta. No aortic aneurysm. Heart is enlarged. No pericardial effusion. No mass or enlarged lymph nodes seen within the mediastinum or perihilar regions. Esophagus is unremarkable. Trachea appears normal. Lungs/Pleura: Interstitial and peribronchial thickening at the lung bases suggesting edema. Bilateral pleural effusions, moderate in size, right slightly greater than left. Associated mild bibasilar atelectasis. No evidence of pneumonia. No pneumothorax seen. Upper abdomen: Limited images of the upper abdomen are unremarkable. Musculoskeletal:  Degenerative changes and ankylosis throughout the kyphotic thoracic spine. Mild chronic compression deformity of the T12 vertebral body. Additional degenerative changes at both shoulders, right greater than left. No acute or suspicious osseous finding. Superficial soft tissues are unremarkable. Review of the MIP images confirms the above findings. IMPRESSION: 1. No pulmonary embolism, with mild study limitations detailed above. 2. Cardiomegaly. 3. Bibasilar edema suggesting CHF. 4. Moderate-sized pleural effusions bilaterally, right slightly greater than left, with associated mild bibasilar atelectasis. 5. No evidence of pneumonia. 6. Aortic atherosclerosis. Electronically Signed   By: Bary RichardStan  Maynard M.D.   On: 08/28/2015 12:12   Dg Chest Port 1 View  Result Date: 08/28/2015 CLINICAL DATA:  Cough and chest pain EXAM: PORTABLE CHEST 1 VIEW COMPARISON:  08/27/2015 FINDINGS: Cardiac shadow is again mildly enlarged. Aortic calcifications are again seen. The lungs are well aerated with slight increasing infiltrate at the bases particularly on the right. No bony abnormality is seen. IMPRESSION: Increasing bibasilar changes particularly on the right. Electronically Signed   By: Alcide CleverMark  Lukens M.D.   On: 08/28/2015 09:21      No results found for: HGBA1C Lab Results  Component Value Date   CREATININE 1.10 (H) 08/29/2015       Scheduled Meds: . aspirin EC  81 mg Oral Daily  . calcium carbonate  1 tablet Oral TID WC  . diltiazem  30 mg Oral Q6H  . fesoterodine  4 mg Oral Daily  . furosemide  20 mg Intravenous BID  . heparin  5,000 Units Subcutaneous Q8H  . levothyroxine  25 mcg Oral QAC breakfast  . metoprolol tartrate  25 mg Oral BID  . sodium chloride flush  3 mL Intravenous Q12H   Continuous Infusions:    LOS: 2 days    Time spent: >30 MINS    [facility] Surgery CenterBROL,Aadvika Konen  [facility] Pager [phone]. If 7PM-7AM, please contact night-coverage at [email or web address], password [email or web address] 08/29/2015, 8:48 AM   LOS: 2 days

## 2015-08-29 NOTE — Care Management Note (Signed)
Case Management Note  Patient Details  Name: [name] [name] MRN: [id number] Date of Birth: 1923/08/08  Subjective/Objective:    NCM spoke with patient she lives with her spouse, she has a PCP Dr. Gardenia PhlegmBjyas, she has insurance to cover her medications, and her spouse will provide transport at dc for her.  [facility] HH[facility]/HHaide.  Soc will begin 24- 48 hrs post discharge.  NCM will cont to follow for dc  Needs.                 Action/Plan:   Expected Discharge Date:                  Expected Discharge Plan:  Home w Home Health Services  In-House Referral:     Discharge planning Services  CM Consult  Post Acute Care Choice:    Choice offered to:  Patient  DME Arranged:    DME Agency:     HH Arranged:  RN, PT, Nurse's Aide HH Agency:  [facility]  Status of Service:  In process, will continue to follow  If discussed at Long Length of Stay Meetings, dates discussed:    Additional Comments:  Leone Havenaylor, Lexis Potenza [place], RN 08/29/2015, 2:59 PM

## 2015-08-29 NOTE — Plan of Care (Signed)
Problem: Education: Goal: Knowledge of [facility] Education information/materials will improve Outcome: Progressing Patient aware of plan of care.  RN provided medication education to patient on all medications administered thus far this shift.  Patient stated understanding.  Patient has denied pain thus far this shift.  Patient denied falls within the past six months to RN.  Per previous RN charting and MD note, patient has had falls.  Bed alarm on, call light within reach.  RN instructed patient to call and wait for staff assistance prior to getting out of bed.  Patient stated understanding.

## 2015-08-29 NOTE — Progress Notes (Addendum)
SUBJECTIVE:  No complaints  OBJECTIVE:   Vitals:   Vitals:   08/28/15 1709 08/28/15 2005 08/29/15 0000 08/29/15 0419  BP: (!) 140/92 (!) 137/95 105/74 (!) 111/94  Pulse: (!) 139 (!) 140 (!) 136 (!) 136  Resp:  18 14 19   Temp: 98.5 F (36.9 C) 98.3 F (36.8 C) 98.7 F (37.1 C) 98.1 F (36.7 C)  TempSrc: Oral Oral Oral Oral  SpO2: 99% 93% 92% 95%  Weight:    152 lb (68.9 kg)  Height:       I&O's:   Intake/Output Summary (Last 24 hours) at 08/29/15 1016 Last data filed at 08/29/15 0817  Gross per 24 hour  Intake              360 ml  Output              700 ml  Net             -340 ml   TELEMETRY: Reviewed telemetry pt in atrial fibrillation with episodes of tachycardia and bradycardia:     PHYSICAL EXAM General: Well developed, well nourished, in no acute distress Head: Eyes PERRLA, No xanthomas.   Normal cephalic and atramatic  Lungs:   Crackles at bases Heart:   Irregularly irregular S1 S2 Pulses are 2+ & equal. Abdomen: Bowel sounds are positive, abdomen soft and non-tender without masses  Msk:  Back normal, normal gait. Normal strength and tone for age. Extremities:   No clubbing, cyanosis or edema.  DP +1 Neuro: Alert and oriented X 3. Psych:  Good affect, responds appropriately   LABS: Basic Metabolic Panel:  Recent Labs  16/10/9606/17/17 0148 08/29/15 0500  NA 128* 128*  K 4.5 4.4  CL 99* 95*  CO2 26 23  GLUCOSE 123* 99  BUN 11 12  CREATININE 1.20* 1.10*  CALCIUM 8.2* 8.6*  MG 2.0  --    Liver Function Tests:  Recent Labs  08/28/15 0148 08/29/15 0500  AST 30 29  ALT 27 30  ALKPHOS 64 71  BILITOT 0.6 0.6  PROT 5.6* 6.1*  ALBUMIN 3.1* 3.1*   No results for input(s): LIPASE, AMYLASE in the last 72 hours. CBC:  Recent Labs  08/28/15 0148 08/29/15 0500  WBC 6.7 7.8  NEUTROABS 5.1  --   HGB 11.4* 11.6*  HCT 34.1* 35.0*  MCV 93.9 93.8  PLT 193 191   Cardiac Enzymes:  Recent Labs  08/28/15 0015 08/28/15 0532 08/28/15 1301    TROPONINI 0.03* <0.03 <0.03   BNP: Invalid input(s): POCBNP D-Dimer: No results for input(s): DDIMER in the last 72 hours. Hemoglobin A1C: No results for input(s): HGBA1C in the last 72 hours. Fasting Lipid Panel: No results for input(s): CHOL, HDL, LDLCALC, TRIG, CHOLHDL, LDLDIRECT in the last 72 hours. Thyroid Function Tests:  Recent Labs  08/28/15 0148  TSH 4.255   Anemia Panel: No results for input(s): VITAMINB12, FOLATE, FERRITIN, TIBC, IRON, RETICCTPCT in the last 72 hours. Coag Panel:   Lab Results  Component Value Date   INR 1.27 08/28/2015    RADIOLOGY: Ct Angio Chest Pe W Or Wo Contrast  Result Date: 08/28/2015 CLINICAL DATA:  Shortness of breath for few days, episodes chest pain. EXAM: CT ANGIOGRAPHY CHEST WITH CONTRAST TECHNIQUE: Multidetector CT imaging of the chest was performed using the standard protocol during bolus administration of intravenous contrast. Multiplanar CT image reconstructions and MIPs were obtained to evaluate the vascular anatomy. CONTRAST:  80 cc Isovue 370 COMPARISON:  Chest  x-ray from earlier same day and chest CT dated 02/12/2011. FINDINGS: Mediastinum/Lymph Nodes: Some of the most peripheral segmental and subsegmental pulmonary arteries are difficult to definitively characterize due to patient breathing motion artifact but there is no pulmonary embolism identified within the main, lobar or central segmental pulmonary arteries bilaterally. Atherosclerotic changes along the walls of the normal-caliber thoracic aorta. No aortic aneurysm. Heart is enlarged. No pericardial effusion. No mass or enlarged lymph nodes seen within the mediastinum or perihilar regions. Esophagus is unremarkable. Trachea appears normal. Lungs/Pleura: Interstitial and peribronchial thickening at the lung bases suggesting edema. Bilateral pleural effusions, moderate in size, right slightly greater than left. Associated mild bibasilar atelectasis. No evidence of pneumonia. No  pneumothorax seen. Upper abdomen: Limited images of the upper abdomen are unremarkable. Musculoskeletal: Degenerative changes and ankylosis throughout the kyphotic thoracic spine. Mild chronic compression deformity of the T12 vertebral body. Additional degenerative changes at both shoulders, right greater than left. No acute or suspicious osseous finding. Superficial soft tissues are unremarkable. Review of the MIP images confirms the above findings. IMPRESSION: 1. No pulmonary embolism, with mild study limitations detailed above. 2. Cardiomegaly. 3. Bibasilar edema suggesting CHF. 4. Moderate-sized pleural effusions bilaterally, right slightly greater than left, with associated mild bibasilar atelectasis. 5. No evidence of pneumonia. 6. Aortic atherosclerosis. Electronically Signed   By: Bary Richard M.D.   On: 08/28/2015 12:12   Dg Chest Port 1 View  Result Date: 08/28/2015 CLINICAL DATA:  Cough and chest pain EXAM: PORTABLE CHEST 1 VIEW COMPARISON:  08/27/2015 FINDINGS: Cardiac shadow is again mildly enlarged. Aortic calcifications are again seen. The lungs are well aerated with slight increasing infiltrate at the bases particularly on the right. No bony abnormality is seen. IMPRESSION: Increasing bibasilar changes particularly on the right. Electronically Signed   By: Alcide Clever M.D.   On: 08/28/2015 09:21    ASSESSMENT/PLAN: 1. Chronic atrial flutter/atrial fibrillation now presenting with RVR. Unsure what triggered the rapid heart rate. Her HR on arrival from [place] was in the 50-60's off Cardizem gtt but then went back into the 140's.  Given a dose of IV Dig due to soft BP but HR not affected.  Did not tolerate IV Cardizem due to low Bp but tolerating PO CCB. She has not been on chronic anticoagulation in the past and I suspect due to frequent falls and advanced age. Her CHADS2VASC score is 4.  Her Free T4 is elevated along with her TSH but only mildly.  I think that she has tachybrady  syndrome as HR will be in the 50's for a while and then she will have bursts of sustained  HR in the 130's despite increasing metoprolol to 25mg  BID and PO CCB. 2D echo showed severe LV dsyfunction with EF 25-35% with moderate AR likely tachycardia mediated. Will stop CCB due to LV dysfunction and increase BB as BP tolerates.  If she has episodes of significant bradycardia then will have to consult EP. Increase metoprolol to 25mg  TID.  Ultimately may need Amio to control HR.  Would avoid starting daily dig given her advanced age and reduced kidney function.  2. Chest pain that is somewhat atypical and worse with laying flat. She does have a chest xray with ? Bibasilar PNA. She has never smoked and really has no cardiac risk factors other than advanced age. She has a LBBB on EKG with no old EKG to compare to. Her echo shows severe LV dysfunction with EF 25-30% but trop is neg x  3.  I suspect this is more likely to be tachycardia mediated. Given her advanced age would pursue a more conservative approach and not pursue invasive ischemic route for workup of possible CAD.  3. SOB with acute systolic CHF.  Several days duration with cough but no fever or chills. CXR with ? Bibasilar PNA. BNP elevated consistent with acute CHF.  Echo with severe LV dysfunction so suspect there is a component of acute systolic CHF although CXR not consistent with that. BNP has increased further. Still has crackles at bases.   Increase lasix to 40mg  IV BID and follow I&O's and daily weights closely.  Renal function stable.  Will hold on adding ACE I so that we have enough BP for uptitrating BB for rate control and diuresing.   4. Leg heaviness and weakness with frequent falls of unknown etiology. She says that she has not passed out although she has a history of neurocardiogenic syncope on her PMH list.   5.  Hyponatremia likely related to CHF.  Hopefully this will improve with diuresis.    6.  Moderate AR and mild  MS on echo  I have spent a total of 45 minutes with patient reviewing echo report , telemetry, EKGs, labs and examining patient as well as establishing an assessment and plan that was discussed with the patient.  > 50% of time was spent in direct patient care.     Armanda Magicraci Turner, MD  08/29/2015  10:16 AM

## 2015-08-29 NOTE — Progress Notes (Signed)
SATURATION QUALIFICATIONS: (This note is used to comply with regulatory documentation for home oxygen)  Patient Saturations on Room Air at Rest = 86%  Patient Saturations on Room Air while Ambulating = %  Patient Saturations on 2 Liters of oxygen while Ambulating = 92%  Please briefly explain why patient needs home oxygen:

## 2015-08-30 LAB — COMPREHENSIVE METABOLIC PANEL
ALBUMIN: 3.1 g/dL — AB (ref 3.5–5.0)
ALK PHOS: 70 U/L (ref 38–126)
ALT: 30 U/L (ref 14–54)
ANION GAP: 9 (ref 5–15)
AST: 28 U/L (ref 15–41)
BILIRUBIN TOTAL: 0.6 mg/dL (ref 0.3–1.2)
BUN: 14 mg/dL (ref 6–20)
CALCIUM: 8.4 mg/dL — AB (ref 8.9–10.3)
CO2: 28 mmol/L (ref 22–32)
Chloride: 93 mmol/L — ABNORMAL LOW (ref 101–111)
Creatinine, Ser: 1.23 mg/dL — ABNORMAL HIGH (ref 0.44–1.00)
GFR calc non Af Amer: 37 mL/min — ABNORMAL LOW (ref >60)
GFR, EST AFRICAN AMERICAN: 43 mL/min — AB (ref >60)
GLUCOSE: 90 mg/dL (ref 65–99)
POTASSIUM: 3.3 mmol/L — AB (ref 3.5–5.1)
SODIUM: 130 mmol/L — AB (ref 135–145)
TOTAL PROTEIN: 6.2 g/dL — AB (ref 6.5–8.1)

## 2015-08-30 LAB — CBC
HCT: 33.5 % — ABNORMAL LOW (ref 36.0–46.0)
HEMOGLOBIN: 11.3 g/dL — AB (ref 12.0–15.0)
MCH: 31.7 pg (ref 26.0–34.0)
MCHC: 33.7 g/dL (ref 30.0–36.0)
MCV: 93.8 fL (ref 78.0–100.0)
Platelets: 171 10*3/uL (ref 150–400)
RBC: 3.57 MIL/uL — AB (ref 3.87–5.11)
RDW: 12 % (ref 11.5–15.5)
WBC: 6.5 10*3/uL (ref 4.0–10.5)

## 2015-08-30 LAB — MAGNESIUM: Magnesium: 1.6 mg/dL — ABNORMAL LOW (ref 1.7–2.4)

## 2015-08-30 LAB — PROCALCITONIN: Procalcitonin: <0.1 ng/mL

## 2015-08-30 MED ORDER — METOPROLOL TARTRATE 5 MG/5ML IV SOLN
INTRAVENOUS | Status: AC
  Filled 2015-08-30: qty 5

## 2015-08-30 MED ORDER — POTASSIUM CHLORIDE CRYS ER 20 MEQ PO TBCR
40.0000 meq | EXTENDED_RELEASE_TABLET | Freq: Two times a day (BID) | ORAL | Status: AC
  Administered 2015-08-30 (×2): 40 meq via ORAL
  Filled 2015-08-30 (×2): qty 2

## 2015-08-30 MED ORDER — METOPROLOL TARTRATE 50 MG PO TABS
50.0000 mg | ORAL_TABLET | Freq: Two times a day (BID) | ORAL | Status: DC
  Administered 2015-08-30 – 2015-08-31 (×2): 50 mg via ORAL
  Filled 2015-08-30 (×2): qty 1

## 2015-08-30 MED ORDER — AMIODARONE HCL 200 MG PO TABS
200.0000 mg | ORAL_TABLET | Freq: Two times a day (BID) | ORAL | Status: DC
Start: 2015-08-30 — End: 2015-09-02
  Administered 2015-08-30 – 2015-09-02 (×7): 200 mg via ORAL
  Filled 2015-08-30 (×7): qty 1

## 2015-08-30 MED ORDER — MAGNESIUM SULFATE 2 GM/50ML IV SOLN
2.0000 g | Freq: Once | INTRAVENOUS | Status: AC
  Administered 2015-08-30: 2 g via INTRAVENOUS
  Filled 2015-08-30: qty 50

## 2015-08-30 MED ORDER — POLYETHYLENE GLYCOL 3350 17 G PO PACK
17.0000 g | PACK | Freq: Once | ORAL | Status: DC
  Filled 2015-08-30: qty 1

## 2015-08-30 MED ORDER — METOPROLOL TARTRATE 5 MG/5ML IV SOLN
5.0000 mg | INTRAVENOUS | Status: AC
  Administered 2015-08-30: 5 mg via INTRAVENOUS

## 2015-08-30 MED ORDER — DOCUSATE SODIUM 100 MG PO CAPS
100.0000 mg | ORAL_CAPSULE | Freq: Two times a day (BID) | ORAL | Status: DC
Start: 2015-08-30 — End: 2015-09-02
  Administered 2015-08-30 – 2015-08-31 (×3): 100 mg via ORAL
  Filled 2015-08-30 (×6): qty 1

## 2015-08-30 NOTE — Progress Notes (Signed)
Pt started her dinner. HR went into 150s Sustaining. Pt complains of "feelng funny, and tired". MD notified. Lopressor VO.

## 2015-08-30 NOTE — Progress Notes (Signed)
[facility] PROGRESS NOTE  [name] [name] ZOX:[id number] DOB: Aug 15, 1923 DOA: 08/27/2015   PCP: Ignatius Specking, MD     Assessment/Plan: Principal Problem:   Chest pain Active Problems:   Atrial fibrillation with RVR (HCC)   Cough   Hypothyroidism   Hyponatremia   Bilateral leg weakness   Chest pain at rest   SOB (shortness of breath)   DCM (dilated cardiomyopathy) (HCC)   Acute systolic CHF (congestive heart failure) (HCC)    82yo WF with a history of chronic atrial fibrillation not on chronic anticoagulation (patient does not know why but most likely due to frequent falls and advanced age), diverticulosis with chronic fecal incontinence, GERD, anxiety, arthritis, ? Pancreatic tumor (per medical records from [place]) and hypotension.She was found to be in atrial flutter with RVR and was given Cardizem and started on a drip  Assessment and plan  Atrial fibrillation with RVR , heart rate continues to be in the 130s - Cardiology consulted, troponin 0.03,<0.03 - Was on diltiazem infusion at outside hospital, HR up again since 8/17, started on by mouth Cardizem , after IV push with minimal effect She has not been on chronic anticoagulation in the past   due to frequent falls and advanced age. Her CHADS2VASC score is 4.  Her Free T4 is elevated along with her TSH but only mildly.  Her HR is in the 110-130bpm range.  Currently on metoprolol to 25mg  BID  Doubt pneumonia as pro-calcitonin negative - CHADS-VASc at least 39 (age x2, gender, HTN)  - Not on AC, presumably d/t falls  CTA  no PE, cardiomegaly, moderate-sized bilateral pleural effusions, no pneumonia stop CCB due to LV dysfunction and increase BB as BP tolerates Metoprolol increased to 50 mg twice a day,.    Would avoid starting daily dig given her advanced age and reduced kidney function Cardiology added amiodarone 200 mg twice a day    Chest pain  No evidence of pneumonia, has bilateral pleural  effusions - Troponins  remained negative, 2-D echo shows EF of 25-30%, diffuse hypokinesis, may need a cardiac cath, deferred to cardiology - Given ASA 324 at outside hospital , currently on aspirin 81 mg a day   Acute on chronic systolic congestive heart failure-tachycardia mediated Currently on Lasix will increase dose if blood pressure tolerates Increase lasix to 40mg  IV BID and follow I&O's and daily weights closely.  Renal function stable Will hold on adding ACE I so that we have enough BP for uptitrating BB for rate control and diuresing    Leg weakness, likely secondary to all of the above - Uncertain etiology, likely multifactorial  - Hyponatremia may be contributing and will be managed as below  - No back pain or numbness/tingling to suggest a cord-compression or radiculopathy - PT eval  when patient is medically stable    Hyponatremia  - Serum sodium 127 at outside hospital , now 128 , likely secondary to volume overload, hopefully should improve with diuresis - SLIV for now and follow daily BMP  - CXR, BNP 768.9,  could be secondary to  congestive heart failure,      Hypothyroidism  - Recently started on Synthroid 25 mcg - TSH and free T4 at outside hospital are both minimally elevated  - Continue current-dose Synthroid for now, though would be reasonable to hold in setting of a fib RVR without clear precipitant    Cough  - CXR at outside hospital reported as  bibasilar opacities, atelectasis vs PNA  - There is no fever or leukocytosis and cough is not productive  - Unable to load outside films, will repeat CXR here and pro-calcitonin negative, doubt pneumonia     DVT prophylaxsis heparin  Code Status:  Full code     Family Communication: Discussed in detail with the patient, all imaging results, lab results explained to the patient   Disposition Plan: Disposition as per cardiology       Consultants:  Cardiology  Procedures:  None  Antibiotics: Anti-infectives    None         HPI/Subjective: Denies any chest pain and shortness of breath but continues to be tachycardic  Objective: Vitals:   08/29/15 1447 08/29/15 2042 08/30/15 0439 08/30/15 0842  BP: 135/87 (!) 127/97 (!) 141/51 129/85  Pulse: (!) 102 (!) 105 99   Resp: (!) 22 (!) 25 14 20   Temp: 97.8 F (36.6 C) 98 F (36.7 C) 98 F (36.7 C)   TempSrc: Oral Oral Oral   SpO2: 100% 100% 98% 100%  Weight:   68.9 kg (152 lb)   Height:        Intake/Output Summary (Last 24 hours) at 08/30/15 1128 Last data filed at 08/30/15 0834  Gross per 24 hour  Intake              703 ml  Output             2450 ml  Net            -1747 ml    Exam:  Examination:  General exam: Appears calm and comfortable  Respiratory system: Clear to auscultation. Respiratory effort normal. Cardiovascular system: S1 & S2 heard, RRR. No JVD, murmurs, rubs, gallops or clicks. No pedal edema. Gastrointestinal system: Abdomen is nondistended, soft and nontender. No organomegaly or masses felt. Normal bowel sounds heard. Central nervous system: Alert and oriented. No focal neurological deficits. Extremities: Symmetric 5 x 5 power. Skin: No rashes, lesions or ulcers Psychiatry: Judgement and insight appear normal. Mood & affect appropriate.     Data Reviewed: I have personally reviewed following labs and imaging studies  Micro Results Recent Results (from the past 240 hour(s))  MRSA PCR Screening     Status: None   Collection Time: 08/27/15 10:35 PM  Result Value Ref Range Status   MRSA by PCR NEGATIVE NEGATIVE Final    Comment:        The GeneXpert MRSA Assay (FDA approved for NASAL specimens only), is one component of a comprehensive MRSA colonization surveillance program. It is not intended to diagnose MRSA infection nor to guide or monitor treatment for MRSA infections.     Radiology Reports Ct Angio  Chest Pe W Or Wo Contrast  Result Date: 08/28/2015 CLINICAL DATA:  Shortness of breath for few days, episodes chest pain. EXAM: CT ANGIOGRAPHY CHEST WITH CONTRAST TECHNIQUE: Multidetector CT imaging of the chest was performed using the standard protocol during bolus administration of intravenous contrast. Multiplanar CT image reconstructions and MIPs were obtained to evaluate the vascular anatomy. CONTRAST:  80 cc Isovue 370 COMPARISON:  Chest x-ray from earlier same day and chest CT dated 02/12/2011. FINDINGS: Mediastinum/Lymph Nodes: Some of the most peripheral segmental and subsegmental pulmonary arteries are difficult to definitively characterize due to patient breathing motion artifact but there is no pulmonary embolism identified within the main, lobar or central segmental pulmonary arteries bilaterally. Atherosclerotic changes along the walls of the normal-caliber thoracic aorta. No aortic  aneurysm. Heart is enlarged. No pericardial effusion. No mass or enlarged lymph nodes seen within the mediastinum or perihilar regions. Esophagus is unremarkable. Trachea appears normal. Lungs/Pleura: Interstitial and peribronchial thickening at the lung bases suggesting edema. Bilateral pleural effusions, moderate in size, right slightly greater than left. Associated mild bibasilar atelectasis. No evidence of pneumonia. No pneumothorax seen. Upper abdomen: Limited images of the upper abdomen are unremarkable. Musculoskeletal: Degenerative changes and ankylosis throughout the kyphotic thoracic spine. Mild chronic compression deformity of the T12 vertebral body. Additional degenerative changes at both shoulders, right greater than left. No acute or suspicious osseous finding. Superficial soft tissues are unremarkable. Review of the MIP images confirms the above findings. IMPRESSION: 1. No pulmonary embolism, with mild study limitations detailed above. 2. Cardiomegaly. 3. Bibasilar edema suggesting CHF. 4. Moderate-sized  pleural effusions bilaterally, right slightly greater than left, with associated mild bibasilar atelectasis. 5. No evidence of pneumonia. 6. Aortic atherosclerosis. Electronically Signed   By: Bary Richard M.D.   On: 08/28/2015 12:12   Dg Chest Port 1 View  Result Date: 08/28/2015 CLINICAL DATA:  Cough and chest pain EXAM: PORTABLE CHEST 1 VIEW COMPARISON:  08/27/2015 FINDINGS: Cardiac shadow is again mildly enlarged. Aortic calcifications are again seen. The lungs are well aerated with slight increasing infiltrate at the bases particularly on the right. No bony abnormality is seen. IMPRESSION: Increasing bibasilar changes particularly on the right. Electronically Signed   By: Alcide Clever M.D.   On: 08/28/2015 09:21     CBC  Recent Labs Lab 08/28/15 0148 08/29/15 0500 08/30/15 0341  WBC 6.7 7.8 6.5  HGB 11.4* 11.6* 11.3*  HCT 34.1* 35.0* 33.5*  PLT 193 191 171  MCV 93.9 93.8 93.8  MCH 31.4 31.1 31.7  MCHC 33.4 33.1 33.7  RDW 11.9 12.0 12.0  LYMPHSABS 1.2  --   --   MONOABS 0.5  --   --   EOSABS 0.0  --   --   BASOSABS 0.0  --   --     Chemistries   Recent Labs Lab 08/28/15 0148 08/29/15 0500 08/30/15 0341 08/30/15 1008  NA 128* 128* 130*  --   K 4.5 4.4 3.3*  --   CL 99* 95* 93*  --   CO2 26 23 28   --   GLUCOSE 123* 99 90  --   BUN 11 12 14   --   CREATININE 1.20* 1.10* 1.23*  --   CALCIUM 8.2* 8.6* 8.4*  --   MG 2.0  --   --  1.6*  AST 30 29 28   --   ALT 27 30 30   --   ALKPHOS 64 71 70  --   BILITOT 0.6 0.6 0.6  --    ------------------------------------------------------------------------------------------------------------------ estimated creatinine clearance is 27 mL/min (by C-G formula based on SCr of 1.23 mg/dL). ------------------------------------------------------------------------------------------------------------------ No results for input(s): HGBA1C in the last 72  hours. ------------------------------------------------------------------------------------------------------------------ No results for input(s): CHOL, HDL, LDLCALC, TRIG, CHOLHDL, LDLDIRECT in the last 72 hours. ------------------------------------------------------------------------------------------------------------------  Recent Labs  08/28/15 0148  TSH 4.255   ------------------------------------------------------------------------------------------------------------------ No results for input(s): VITAMINB12, FOLATE, FERRITIN, TIBC, IRON, RETICCTPCT in the last 72 hours.  Coagulation profile  Recent Labs Lab 08/28/15 0148  INR 1.27    No results for input(s): DDIMER in the last 72 hours.  Cardiac Enzymes  Recent Labs Lab 08/28/15 0015 08/28/15 0532 08/28/15 1301  TROPONINI 0.03* <0.03 <0.03   ------------------------------------------------------------------------------------------------------------------ Invalid input(s): POCBNP   CBG: No results for input(s): GLUCAP  in the last 168 hours.     Studies: Ct Angio Chest Pe W Or Wo Contrast  Result Date: 08/28/2015 CLINICAL DATA:  Shortness of breath for few days, episodes chest pain. EXAM: CT ANGIOGRAPHY CHEST WITH CONTRAST TECHNIQUE: Multidetector CT imaging of the chest was performed using the standard protocol during bolus administration of intravenous contrast. Multiplanar CT image reconstructions and MIPs were obtained to evaluate the vascular anatomy. CONTRAST:  80 cc Isovue 370 COMPARISON:  Chest x-ray from earlier same day and chest CT dated 02/12/2011. FINDINGS: Mediastinum/Lymph Nodes: Some of the most peripheral segmental and subsegmental pulmonary arteries are difficult to definitively characterize due to patient breathing motion artifact but there is no pulmonary embolism identified within the main, lobar or central segmental pulmonary arteries bilaterally. Atherosclerotic changes along the walls of the  normal-caliber thoracic aorta. No aortic aneurysm. Heart is enlarged. No pericardial effusion. No mass or enlarged lymph nodes seen within the mediastinum or perihilar regions. Esophagus is unremarkable. Trachea appears normal. Lungs/Pleura: Interstitial and peribronchial thickening at the lung bases suggesting edema. Bilateral pleural effusions, moderate in size, right slightly greater than left. Associated mild bibasilar atelectasis. No evidence of pneumonia. No pneumothorax seen. Upper abdomen: Limited images of the upper abdomen are unremarkable. Musculoskeletal: Degenerative changes and ankylosis throughout the kyphotic thoracic spine. Mild chronic compression deformity of the T12 vertebral body. Additional degenerative changes at both shoulders, right greater than left. No acute or suspicious osseous finding. Superficial soft tissues are unremarkable. Review of the MIP images confirms the above findings. IMPRESSION: 1. No pulmonary embolism, with mild study limitations detailed above. 2. Cardiomegaly. 3. Bibasilar edema suggesting CHF. 4. Moderate-sized pleural effusions bilaterally, right slightly greater than left, with associated mild bibasilar atelectasis. 5. No evidence of pneumonia. 6. Aortic atherosclerosis. Electronically Signed   By: Bary RichardStan  Maynard M.D.   On: 08/28/2015 12:12      No results found for: HGBA1C Lab Results  Component Value Date   CREATININE 1.23 (H) 08/30/2015       Scheduled Meds: . amiodarone  200 mg Oral BID  . aspirin EC  81 mg Oral Daily  . calcium carbonate  1 tablet Oral TID WC  . fesoterodine  4 mg Oral Daily  . furosemide  40 mg Intravenous BID  . heparin  5,000 Units Subcutaneous Q8H  . levothyroxine  25 mcg Oral QAC breakfast  . metoprolol tartrate  50 mg Oral BID  . potassium chloride  40 mEq Oral BID  . sodium chloride flush  3 mL Intravenous Q12H   Continuous Infusions:    LOS: 3 days    Time spent: >30 MINS    Ballinger Memorial HospitalBROL,[name] [name]  [facility] Pager [phone]. If 7PM-7AM, please contact night-coverage at [email or web address], password [email or web address] 08/30/2015, 11:28 AM  LOS: 3 days

## 2015-08-30 NOTE — Progress Notes (Signed)
Pt is sleeping, denies "funny feelings". HR in 110s.

## 2015-08-30 NOTE — Progress Notes (Signed)
Lopressor 5 mg given as ordered. Noted some changes on monitor. Pt continue "feeling funny and warm in head". EKG done. HR in 110-130s.

## 2015-08-30 NOTE — Progress Notes (Signed)
SUBJECTIVE:  Feels better  OBJECTIVE:   Vitals:   Vitals:   08/29/15 1447 08/29/15 2042 08/30/15 0439 08/30/15 0842  BP: 135/87 (!) 127/97 (!) 141/51 129/85  Pulse: (!) 102 (!) 105 99   Resp: (!) 22 (!) 25 14 20   Temp: 97.8 F (36.6 C) 98 F (36.7 C) 98 F (36.7 C)   TempSrc: Oral Oral Oral   SpO2: 100% 100% 98% 100%  Weight:   152 lb (68.9 kg)   Height:       I&O's:   Intake/Output Summary (Last 24 hours) at 08/30/15 1058 Last data filed at 08/30/15 0834  Gross per 24 hour  Intake              703 ml  Output             2450 ml  Net            -1747 ml   TELEMETRY: Reviewed telemetry pt in atrial fibrillation with HR 100-130's:     PHYSICAL EXAM General: Well developed, well nourished, in no acute distress Head: Eyes PERRLA, No xanthomas.   Normal cephalic and atramatic  Lungs:   Few scant crackles at bases. Heart: irregularly irregular S1 S2 Pulses are 2+ & equal.ts. Abdomen: Bowel sounds are positive, abdomen soft and non-tender without masses  Msk:  Back normal, normal gait. Normal strength and tone for age. Extremities:   No clubbing, cyanosis or edema.  DP +1 Neuro: Alert and oriented X 3. Psych:  Good affect, responds appropriately   LABS: Basic Metabolic Panel:  Recent Labs  16/10/96 0148 08/29/15 0500 08/30/15 0341 08/30/15 1008  NA 128* 128* 130*  --   K 4.5 4.4 3.3*  --   CL 99* 95* 93*  --   CO2 26 23 28   --   GLUCOSE 123* 99 90  --   BUN 11 12 14   --   CREATININE 1.20* 1.10* 1.23*  --   CALCIUM 8.2* 8.6* 8.4*  --   MG 2.0  --   --  1.6*   Liver Function Tests:  Recent Labs  08/29/15 0500 08/30/15 0341  AST 29 28  ALT 30 30  ALKPHOS 71 70  BILITOT 0.6 0.6  PROT 6.1* 6.2*  ALBUMIN 3.1* 3.1*   No results for input(s): LIPASE, AMYLASE in the last 72 hours. CBC:  Recent Labs  08/28/15 0148 08/29/15 0500 08/30/15 0341  WBC 6.7 7.8 6.5  NEUTROABS 5.1  --   --   HGB 11.4* 11.6* 11.3*  HCT 34.1* 35.0* 33.5*  MCV 93.9 93.8  93.8  PLT 193 191 171   Cardiac Enzymes:  Recent Labs  08/28/15 0015 08/28/15 0532 08/28/15 1301  TROPONINI 0.03* <0.03 <0.03   BNP: Invalid input(s): POCBNP D-Dimer: No results for input(s): DDIMER in the last 72 hours. Hemoglobin A1C: No results for input(s): HGBA1C in the last 72 hours. Fasting Lipid Panel: No results for input(s): CHOL, HDL, LDLCALC, TRIG, CHOLHDL, LDLDIRECT in the last 72 hours. Thyroid Function Tests:  Recent Labs  08/28/15 0148  TSH 4.255   Anemia Panel: No results for input(s): VITAMINB12, FOLATE, FERRITIN, TIBC, IRON, RETICCTPCT in the last 72 hours. Coag Panel:   Lab Results  Component Value Date   INR 1.27 08/28/2015    RADIOLOGY: Ct Angio Chest Pe W Or Wo Contrast  Result Date: 08/28/2015 CLINICAL DATA:  Shortness of breath for few days, episodes chest pain. EXAM: CT ANGIOGRAPHY CHEST WITH CONTRAST TECHNIQUE:  Multidetector CT imaging of the chest was performed using the standard protocol during bolus administration of intravenous contrast. Multiplanar CT image reconstructions and MIPs were obtained to evaluate the vascular anatomy. CONTRAST:  80 cc Isovue 370 COMPARISON:  Chest x-ray from earlier same day and chest CT dated 02/12/2011. FINDINGS: Mediastinum/Lymph Nodes: Some of the most peripheral segmental and subsegmental pulmonary arteries are difficult to definitively characterize due to patient breathing motion artifact but there is no pulmonary embolism identified within the main, lobar or central segmental pulmonary arteries bilaterally. Atherosclerotic changes along the walls of the normal-caliber thoracic aorta. No aortic aneurysm. Heart is enlarged. No pericardial effusion. No mass or enlarged lymph nodes seen within the mediastinum or perihilar regions. Esophagus is unremarkable. Trachea appears normal. Lungs/Pleura: Interstitial and peribronchial thickening at the lung bases suggesting edema. Bilateral pleural effusions, moderate in size,  right slightly greater than left. Associated mild bibasilar atelectasis. No evidence of pneumonia. No pneumothorax seen. Upper abdomen: Limited images of the upper abdomen are unremarkable. Musculoskeletal: Degenerative changes and ankylosis throughout the kyphotic thoracic spine. Mild chronic compression deformity of the T12 vertebral body. Additional degenerative changes at both shoulders, right greater than left. No acute or suspicious osseous finding. Superficial soft tissues are unremarkable. Review of the MIP images confirms the above findings. IMPRESSION: 1. No pulmonary embolism, with mild study limitations detailed above. 2. Cardiomegaly. 3. Bibasilar edema suggesting CHF. 4. Moderate-sized pleural effusions bilaterally, right slightly greater than left, with associated mild bibasilar atelectasis. 5. No evidence of pneumonia. 6. Aortic atherosclerosis. Electronically Signed   By: Bary RichardStan  Maynard M.D.   On: 08/28/2015 12:12   Dg Chest Port 1 View  Result Date: 08/28/2015 CLINICAL DATA:  Cough and chest pain EXAM: PORTABLE CHEST 1 VIEW COMPARISON:  08/27/2015 FINDINGS: Cardiac shadow is again mildly enlarged. Aortic calcifications are again seen. The lungs are well aerated with slight increasing infiltrate at the bases particularly on the right. No bony abnormality is seen. IMPRESSION: Increasing bibasilar changes particularly on the right. Electronically Signed   By: Alcide CleverMark  Lukens M.D.   On: 08/28/2015 09:21    ASSESSMENT/PLAN: 1. Chronic atrial flutter/atrial fibrillation now presenting with RVR. Unsure what triggered the rapid heart rate. [place] 50-60's off Cardizem gtt but then went back into the 140's. Given a dose of IV Dig due to soft BP but HR not affected. Did not tolerate IV Cardizem due to low Bp but tolerating PO CCB. She has not been on chronic anticoagulation in the past and I suspect due to frequent falls and advanced age. Her CHADS2VASC score is  4. Her Free T4 is elevated along with her TSH but only mildly. I think that she has tachybrady syndrome as HR will be in the 50's for a while and then she will have bursts of sustained  HR in the 130's despite increasing metoprolol to 25mg  BID and PO CCB. 2D echo showed severe LV dsyfunction with EF 25-35% with moderate AR likely tachycardia mediated. Stopped CCB due to LV dysfunction and increased BB to 25mg  TID but HR still going up into the 130's at rest.  Would avoid starting daily dig given her advanced age and reduced kidney function.  I will add Amio 200mg  BID for better rate control and increase metoprolol to 50mg  BID.  2. Chest pain that is somewhat atypical and worse with laying flat. She does have a chest xray with ? Bibasilar PNA. She has never smoked and really has  no cardiac risk factors other than advanced age. She has a LBBB on EKG with no old EKG to compare to. Her echo shows severe LV dysfunction with EF 25-30% but trop is neg x 3.  I suspect this is more likely to be tachycardia mediated. Given her advanced age would pursue a more conservative approach and not pursue invasive ischemic  workup of possible CAD.  3. SOB with acute systolic CHF.  Several days duration with cough but no fever or chills. CXR with ? Bibasilar PNA. BNP elevated consistent with acute CHF. Echo with severe LV dysfunction so suspect there is a component of acute systolic CHF although CXR not consistent with that. BNP has increased further. Still has crackles at bases.   Increased lasix to 40mg  IV BID yesterday with 2.6L UOP.  She is net neg 1.9L but weight the same.   Renal function slightly bumped but stable.  Will hold on adding ACE I so that we have enough BP for uptitrating BB for rate control and diuresing.    4. Leg heaviness and weakness with frequent falls of unknown etiology. She says that she has not passed out although she has a history of neurocardiogenic syncope on her PMH list.   5.  Hyponatremia likely related to CHF.  Improved slightly with diuresis.  6.  Moderate AR and mild MS on echo    Armanda Magicraci Ermalinda Joubert, MD  08/30/2015  10:58 AM

## 2015-08-31 ENCOUNTER — Other Ambulatory Visit (HOSPITAL_COMMUNITY): Payer: Medicare Other

## 2015-08-31 LAB — COMPREHENSIVE METABOLIC PANEL
ALBUMIN: 3 g/dL — AB (ref 3.5–5.0)
ALT: 30 U/L (ref 14–54)
ANION GAP: 13 (ref 5–15)
AST: 25 U/L (ref 15–41)
Alkaline Phosphatase: 73 U/L (ref 38–126)
BILIRUBIN TOTAL: 0.4 mg/dL (ref 0.3–1.2)
BUN: 11 mg/dL (ref 6–20)
CO2: 28 mmol/L (ref 22–32)
Calcium: 8.7 mg/dL — ABNORMAL LOW (ref 8.9–10.3)
Chloride: 87 mmol/L — ABNORMAL LOW (ref 101–111)
Creatinine, Ser: 1.13 mg/dL — ABNORMAL HIGH (ref 0.44–1.00)
GFR calc Af Amer: 48 mL/min — ABNORMAL LOW (ref >60)
GFR calc non Af Amer: 41 mL/min — ABNORMAL LOW (ref >60)
GLUCOSE: 97 mg/dL (ref 65–99)
POTASSIUM: 4 mmol/L (ref 3.5–5.1)
SODIUM: 128 mmol/L — AB (ref 135–145)
Total Protein: 6 g/dL — ABNORMAL LOW (ref 6.5–8.1)

## 2015-08-31 LAB — CBC
HEMATOCRIT: 33.6 % — AB (ref 36.0–46.0)
HEMOGLOBIN: 11.2 g/dL — AB (ref 12.0–15.0)
MCH: 31.2 pg (ref 26.0–34.0)
MCHC: 33.3 g/dL (ref 30.0–36.0)
MCV: 93.6 fL (ref 78.0–100.0)
Platelets: 208 10*3/uL (ref 150–400)
RBC: 3.59 MIL/uL — ABNORMAL LOW (ref 3.87–5.11)
RDW: 12 % (ref 11.5–15.5)
WBC: 6.1 10*3/uL (ref 4.0–10.5)

## 2015-08-31 MED ORDER — METOPROLOL TARTRATE 25 MG PO TABS
25.0000 mg | ORAL_TABLET | Freq: Once | ORAL | Status: AC
  Administered 2015-08-31: 25 mg via ORAL
  Filled 2015-08-31: qty 1

## 2015-08-31 MED ORDER — METOPROLOL TARTRATE 50 MG PO TABS
75.0000 mg | ORAL_TABLET | Freq: Two times a day (BID) | ORAL | Status: DC
  Administered 2015-08-31 – 2015-09-02 (×4): 75 mg via ORAL
  Filled 2015-08-31 (×5): qty 1

## 2015-08-31 NOTE — Progress Notes (Signed)
NP returned paged with no new orders at present.  Will continue to monitor closely.

## 2015-08-31 NOTE — Progress Notes (Addendum)
[facility] PROGRESS NOTE  [name] [name] ZOX:[id number] DOB: [birth year] DOA: 08/27/2015   PCP: Ignatius Specking, MD     Assessment/Plan: Principal Problem:   Chest pain Active Problems:   Atrial fibrillation with RVR (HCC)   Cough   Hypothyroidism   Hyponatremia   Bilateral leg weakness   Chest pain at rest   SOB (shortness of breath)   DCM (dilated cardiomyopathy) (HCC)   Acute systolic CHF (congestive heart failure) (HCC)    82yo WF with a history of chronic atrial fibrillation not on chronic anticoagulation (patient does not know why but most likely due to frequent falls and advanced age), diverticulosis with chronic fecal incontinence, GERD, anxiety, arthritis, ? Pancreatic tumor (per medical records from Home) and hypotension.She was found to be in atrial flutter with RVR and was given Cardizem and started on a drip  Assessment and plan  Atrial fibrillation with RVR , heart rate continues to be in the 130s - Cardiology following, troponin 0.03,<0.03 - Was on diltiazem infusion at outside hospital, received Cardizem push and by mouth Cardizem which was discontinued She has not been on chronic anticoagulation in the past   due to frequent falls and advanced age. Her CHADS2VASC score is 4.  Her Free T4 is elevated along with her TSH but only mildly.  Doubt pneumonia as pro-calcitonin negative - CHADS-VASc at least 63 (age x2, gender, HTN)  - Not on AC, presumably d/t falls  CTA  no PE, cardiomegaly, moderate-sized bilateral pleural effusions, no pneumonia Discontinued CCB due to LV dysfunction and increase BB as BP tolerates Metoprolol increased to 75 mg twice a day,.    Would avoid starting daily dig given her advanced age and reduced kidney function Cardiology also started patient on amiodarone Heart rate variable 90's-120's    Chest pain  No evidence of pneumonia, has bilateral pleural effusions - Troponins  remained negative, 2-D echo shows  EF of 25-30%, diffuse hypokinesis, may need a cardiac cath, deferred to cardiology - Given ASA 324 at outside hospital , currently on aspirin 81 mg a day   Acute on chronic systolic congestive heart failure-tachycardia mediated Currently on Lasix will increase dose if blood pressure tolerates Continue lasix to 40mg  IV BID and follow I&O's and daily weights closely.  Renal function stable Will hold on adding ACE I so that we have enough BP for uptitrating BB for rate control and diuresing    Leg weakness, likely secondary to all of the above - Uncertain etiology, likely multifactorial  - Hyponatremia may be contributing and will be managed as below  - No back pain or numbness/tingling to suggest a cord-compression or radiculopathy - PT eval tomorrow    Hyponatremia  - Serum sodium 127 at outside hospital , now 128 , likely secondary to volume overload, hopefully should improve with diuresis - SLIV for now and follow daily BMP  - CXR, BNP 768.9,  could be secondary to  congestive heart failure,      Hypothyroidism  - Recently started on Synthroid 25 mcg - TSH and free T4 at outside hospital are both minimally elevated  - Continue current-dose Synthroid for now, though would be reasonable to hold in setting of a fib RVR without clear precipitant    Cough  - CXR at outside hospital reported as bibasilar opacities, atelectasis vs PNA  - There is no fever or leukocytosis and cough is not productive    CT chest on 8/17  did not show any evidence of pneumonia   DVT prophylaxsis heparin  Code Status:  Full code     Family Communication: Discussed in detail with the patient, all imaging results, lab results explained to the patient   Disposition Plan: Disposition as per cardiology      Consultants:  Cardiology  Procedures:  None  Antibiotics: Anti-infectives    None         HPI/Subjective: Denies any chest pain and shortness of breath but continues to be  tachycardic  Objective: Vitals:   08/30/15 0842 08/30/15 1417 08/30/15 1953 08/31/15 0429  BP: 129/85 123/84 125/81 (!) 123/105  Pulse:  (!) 121 (!) 109 (!) 111  Resp: 20 16 16 18   Temp:  97.7 F (36.5 C) 98.4 F (36.9 C) 98.2 F (36.8 C)  TempSrc:  Oral Oral Oral  SpO2: 100% 98% 94% 98%  Weight:    68.7 kg (151 lb 6.4 oz)  Height:        Intake/Output Summary (Last 24 hours) at 08/31/15 1137 Last data filed at 08/31/15 0442  Gross per 24 hour  Intake              360 ml  Output             2575 ml  Net            -2215 ml    Exam:  Examination:  General exam: Appears calm and comfortable  Respiratory system: Clear to auscultation. Respiratory effort normal. Cardiovascular system: S1 & S2 heard, RRR. No JVD, murmurs, rubs, gallops or clicks. No pedal edema. Gastrointestinal system: Abdomen is nondistended, soft and nontender. No organomegaly or masses felt. Normal bowel sounds heard. Central nervous system: Alert and oriented. No focal neurological deficits. Extremities: Symmetric 5 x 5 power. Skin: No rashes, lesions or ulcers Psychiatry: Judgement and insight appear normal. Mood & affect appropriate.     Data Reviewed: I have personally reviewed following labs and imaging studies  Micro Results Recent Results (from the past 240 hour(s))  MRSA PCR Screening     Status: None   Collection Time: 08/27/15 10:35 PM  Result Value Ref Range Status   MRSA by PCR NEGATIVE NEGATIVE Final    Comment:        The GeneXpert MRSA Assay (FDA approved for NASAL specimens only), is one component of a comprehensive MRSA colonization surveillance program. It is not intended to diagnose MRSA infection nor to guide or monitor treatment for MRSA infections.     Radiology Reports Ct Angio Chest Pe W Or Wo Contrast  Result Date: 08/28/2015 CLINICAL DATA:  Shortness of breath for few days, episodes chest pain. EXAM: CT ANGIOGRAPHY CHEST WITH CONTRAST TECHNIQUE: Multidetector  CT imaging of the chest was performed using the standard protocol during bolus administration of intravenous contrast. Multiplanar CT image reconstructions and MIPs were obtained to evaluate the vascular anatomy. CONTRAST:  80 cc Isovue 370 COMPARISON:  Chest x-ray from earlier same day and chest CT dated 02/12/2011. FINDINGS: Mediastinum/Lymph Nodes: Some of the most peripheral segmental and subsegmental pulmonary arteries are difficult to definitively characterize due to patient breathing motion artifact but there is no pulmonary embolism identified within the main, lobar or central segmental pulmonary arteries bilaterally. Atherosclerotic changes along the walls of the normal-caliber thoracic aorta. No aortic aneurysm. Heart is enlarged. No pericardial effusion. No mass or enlarged lymph nodes seen within the mediastinum or perihilar regions. Esophagus is unremarkable. Trachea appears normal. Lungs/Pleura: Interstitial and  peribronchial thickening at the lung bases suggesting edema. Bilateral pleural effusions, moderate in size, right slightly greater than left. Associated mild bibasilar atelectasis. No evidence of pneumonia. No pneumothorax seen. Upper abdomen: Limited images of the upper abdomen are unremarkable. Musculoskeletal: Degenerative changes and ankylosis throughout the kyphotic thoracic spine. Mild chronic compression deformity of the T12 vertebral body. Additional degenerative changes at both shoulders, right greater than left. No acute or suspicious osseous finding. Superficial soft tissues are unremarkable. Review of the MIP images confirms the above findings. IMPRESSION: 1. No pulmonary embolism, with mild study limitations detailed above. 2. Cardiomegaly. 3. Bibasilar edema suggesting CHF. 4. Moderate-sized pleural effusions bilaterally, right slightly greater than left, with associated mild bibasilar atelectasis. 5. No evidence of pneumonia. 6. Aortic atherosclerosis. Electronically Signed   By:  Bary RichardStan  Maynard M.D.   On: 08/28/2015 12:12   Dg Chest Port 1 View  Result Date: 08/28/2015 CLINICAL DATA:  Cough and chest pain EXAM: PORTABLE CHEST 1 VIEW COMPARISON:  08/27/2015 FINDINGS: Cardiac shadow is again mildly enlarged. Aortic calcifications are again seen. The lungs are well aerated with slight increasing infiltrate at the bases particularly on the right. No bony abnormality is seen. IMPRESSION: Increasing bibasilar changes particularly on the right. Electronically Signed   By: Alcide CleverMark  Lukens M.D.   On: 08/28/2015 09:21     CBC  Recent Labs Lab 08/28/15 0148 08/29/15 0500 08/30/15 0341 08/31/15 0400  WBC 6.7 7.8 6.5 6.1  HGB 11.4* 11.6* 11.3* 11.2*  HCT 34.1* 35.0* 33.5* 33.6*  PLT 193 191 171 208  MCV 93.9 93.8 93.8 93.6  MCH 31.4 31.1 31.7 31.2  MCHC 33.4 33.1 33.7 33.3  RDW 11.9 12.0 12.0 12.0  LYMPHSABS 1.2  --   --   --   MONOABS 0.5  --   --   --   EOSABS 0.0  --   --   --   BASOSABS 0.0  --   --   --     Chemistries   Recent Labs Lab 08/28/15 0148 08/29/15 0500 08/30/15 0341 08/30/15 1008 08/31/15 0400  NA 128* 128* 130*  --  128*  K 4.5 4.4 3.3*  --  4.0  CL 99* 95* 93*  --  87*  CO2 26 23 28   --  28  GLUCOSE 123* 99 90  --  97  BUN 11 12 14   --  11  CREATININE 1.20* 1.10* 1.23*  --  1.13*  CALCIUM 8.2* 8.6* 8.4*  --  8.7*  MG 2.0  --   --  1.6*  --   AST 30 29 28   --  25  ALT 27 30 30   --  30  ALKPHOS 64 71 70  --  73  BILITOT 0.6 0.6 0.6  --  0.4   ------------------------------------------------------------------------------------------------------------------ estimated creatinine clearance is 29.4 mL/min (by C-G formula based on SCr of 1.13 mg/dL). ------------------------------------------------------------------------------------------------------------------ No results for input(s): HGBA1C in the last 72 hours. ------------------------------------------------------------------------------------------------------------------ No results  for input(s): CHOL, HDL, LDLCALC, TRIG, CHOLHDL, LDLDIRECT in the last 72 hours. ------------------------------------------------------------------------------------------------------------------ No results for input(s): TSH, T4TOTAL, T3FREE, THYROIDAB in the last 72 hours.  Invalid input(s): FREET3 ------------------------------------------------------------------------------------------------------------------ No results for input(s): VITAMINB12, FOLATE, FERRITIN, TIBC, IRON, RETICCTPCT in the last 72 hours.  Coagulation profile  Recent Labs Lab 08/28/15 0148  INR 1.27    No results for input(s): DDIMER in the last 72 hours.  Cardiac Enzymes  Recent Labs Lab 08/28/15 0015 08/28/15 0532 08/28/15 1301  TROPONINI 0.03* <  0.03 <0.03   ------------------------------------------------------------------------------------------------------------------ Invalid input(s): POCBNP   CBG: No results for input(s): GLUCAP in the last 168 hours.     Studies: No results found.    No results found for: HGBA1C Lab Results  Component Value Date   CREATININE 1.13 (H) 08/31/2015       Scheduled Meds: . amiodarone  200 mg Oral BID  . aspirin EC  81 mg Oral Daily  . calcium carbonate  1 tablet Oral TID WC  . docusate sodium  100 mg Oral BID  . fesoterodine  4 mg Oral Daily  . furosemide  40 mg Intravenous BID  . heparin  5,000 Units Subcutaneous Q8H  . levothyroxine  25 mcg Oral QAC breakfast  . metoprolol tartrate  25 mg Oral Once  . metoprolol tartrate  75 mg Oral BID  . polyethylene glycol  17 g Oral Once  . sodium chloride flush  3 mL Intravenous Q12H   Continuous Infusions:    LOS: 4 days    Time spent: >30 MINS    [facility]  [facility] Pager [phone]. If 7PM-7AM, please contact night-coverage at [email or web address], password [facility] 08/31/2015, 11:37 AM  LOS: 4 days

## 2015-08-31 NOTE — Progress Notes (Signed)
Pt heart rate this am 110-130, BP 131/77.  Pt asymptomatic.  NP advised via text page.  Will cont to monitor.

## 2015-08-31 NOTE — Progress Notes (Signed)
SUBJECTIVE:  No complaints  OBJECTIVE:   Vitals:   Vitals:   08/30/15 0842 08/30/15 1417 08/30/15 1953 08/31/15 0429  BP: 129/85 123/84 125/81 (!) 123/105  Pulse:  (!) 121 (!) 109 (!) 111  Resp: 20 16 16 18   Temp:  97.7 F (36.5 C) 98.4 F (36.9 C) 98.2 F (36.8 C)  TempSrc:  Oral Oral Oral  SpO2: 100% 98% 94% 98%  Weight:    151 lb 6.4 oz (68.7 kg)  Height:       I&O's:   Intake/Output Summary (Last 24 hours) at 08/31/15 1610 Last data filed at 08/31/15 9604  Gross per 24 hour  Intake              360 ml  Output             2575 ml  Net            -2215 ml   TELEMETRY: Reviewed telemetry pt in atrial fibrillation with episodes of RVR:     PHYSICAL EXAM General: Well developed, well nourished, in no acute distress Head: Eyes PERRLA, No xanthomas.   Normal cephalic and atramatic  Lungs:   Clear bilaterally to auscultation and percussion. Heart:   Irregularly irregular S1 S2 Pulses are 2+ & equal. Abdomen: Bowel sounds are positive, abdomen soft and non-tender without masses  Msk:  Back normal, normal gait. Normal strength and tone for age. Extremities:   No clubbing, cyanosis or edema.  DP +1 Neuro: Alert and oriented X 3. Psych:  Good affect, responds appropriately   LABS: Basic Metabolic Panel:  Recent Labs  54/09/81 0341 08/30/15 1008 08/31/15 0400  NA 130*  --  128*  K 3.3*  --  4.0  CL 93*  --  87*  CO2 28  --  28  GLUCOSE 90  --  97  BUN 14  --  11  CREATININE 1.23*  --  1.13*  CALCIUM 8.4*  --  8.7*  MG  --  1.6*  --    Liver Function Tests:  Recent Labs  08/30/15 0341 08/31/15 0400  AST 28 25  ALT 30 30  ALKPHOS 70 73  BILITOT 0.6 0.4  PROT 6.2* 6.0*  ALBUMIN 3.1* 3.0*   No results for input(s): LIPASE, AMYLASE in the last 72 hours. CBC:  Recent Labs  08/30/15 0341 08/31/15 0400  WBC 6.5 6.1  HGB 11.3* 11.2*  HCT 33.5* 33.6*  MCV 93.8 93.6  PLT 171 208   Cardiac Enzymes:  Recent Labs  08/28/15 1301  TROPONINI <0.03    BNP: Invalid input(s): POCBNP D-Dimer: No results for input(s): DDIMER in the last 72 hours. Hemoglobin A1C: No results for input(s): HGBA1C in the last 72 hours. Fasting Lipid Panel: No results for input(s): CHOL, HDL, LDLCALC, TRIG, CHOLHDL, LDLDIRECT in the last 72 hours. Thyroid Function Tests: No results for input(s): TSH, T4TOTAL, T3FREE, THYROIDAB in the last 72 hours.  Invalid input(s): FREET3 Anemia Panel: No results for input(s): VITAMINB12, FOLATE, FERRITIN, TIBC, IRON, RETICCTPCT in the last 72 hours. Coag Panel:   Lab Results  Component Value Date   INR 1.27 08/28/2015    RADIOLOGY: Ct Angio Chest Pe W Or Wo Contrast  Result Date: 08/28/2015 CLINICAL DATA:  Shortness of breath for few days, episodes chest pain. EXAM: CT ANGIOGRAPHY CHEST WITH CONTRAST TECHNIQUE: Multidetector CT imaging of the chest was performed using the standard protocol during bolus administration of intravenous contrast. Multiplanar CT image reconstructions and  MIPs were obtained to evaluate the vascular anatomy. CONTRAST:  80 cc Isovue 370 COMPARISON:  Chest x-ray from earlier same day and chest CT dated 02/12/2011. FINDINGS: Mediastinum/Lymph Nodes: Some of the most peripheral segmental and subsegmental pulmonary arteries are difficult to definitively characterize due to patient breathing motion artifact but there is no pulmonary embolism identified within the main, lobar or central segmental pulmonary arteries bilaterally. Atherosclerotic changes along the walls of the normal-caliber thoracic aorta. No aortic aneurysm. Heart is enlarged. No pericardial effusion. No mass or enlarged lymph nodes seen within the mediastinum or perihilar regions. Esophagus is unremarkable. Trachea appears normal. Lungs/Pleura: Interstitial and peribronchial thickening at the lung bases suggesting edema. Bilateral pleural effusions, moderate in size, right slightly greater than left. Associated mild bibasilar atelectasis. No  evidence of pneumonia. No pneumothorax seen. Upper abdomen: Limited images of the upper abdomen are unremarkable. Musculoskeletal: Degenerative changes and ankylosis throughout the kyphotic thoracic spine. Mild chronic compression deformity of the T12 vertebral body. Additional degenerative changes at both shoulders, right greater than left. No acute or suspicious osseous finding. Superficial soft tissues are unremarkable. Review of the MIP images confirms the above findings. IMPRESSION: 1. No pulmonary embolism, with mild study limitations detailed above. 2. Cardiomegaly. 3. Bibasilar edema suggesting CHF. 4. Moderate-sized pleural effusions bilaterally, right slightly greater than left, with associated mild bibasilar atelectasis. 5. No evidence of pneumonia. 6. Aortic atherosclerosis. Electronically Signed   By: Bary RichardStan  Maynard M.D.   On: 08/28/2015 12:12   Dg Chest Port 1 View  Result Date: 08/28/2015 CLINICAL DATA:  Cough and chest pain EXAM: PORTABLE CHEST 1 VIEW COMPARISON:  08/27/2015 FINDINGS: Cardiac shadow is again mildly enlarged. Aortic calcifications are again seen. The lungs are well aerated with slight increasing infiltrate at the bases particularly on the right. No bony abnormality is seen. IMPRESSION: Increasing bibasilar changes particularly on the right. Electronically Signed   By: Alcide CleverMark  Lukens M.D.   On: 08/28/2015 09:21    ASSESSMENT/PLAN: 1. Chronic atrial flutter/atrial fibrillation now presenting with RVR. Unsure what triggered the rapid heart rate. Her HR on arrival from [place] was in the 50-60's off Cardizem gtt but then went back into the 140's. Given a dose of IV Dig due to soft BP but HR not affected. Did not tolerate IV Cardizem due to low Bp but tolerating PO CCB. She has not been on chronic anticoagulation in the past and I suspect due to frequent falls and advanced age. Her CHADS2VASC score is 4. Her Free T4 is elevated along with her TSH but only mildly. 2D echo  showed severe LV dsyfunction with EF 25-35% with moderate AR likely tachycardia mediated. Stopped CCB due to LV dysfunction and increased BB to 50mg  BID but HR still going up into the 130's at rest. Would avoid starting daily dig given her advanced age and reduced kidney function.  Added Amio 200mg  BID for better rate control and increased metoprolol to 50mg  BID but HR still increasing into the 130's with ambulation.  Increase Lopressor to 75mg  BID and continue PO Amio.   2. Chest pain that is somewhat atypical and worse with laying flat. She does have a chest xray with ? Bibasilar PNA. She has never smoked and really has no cardiac risk factors other than advanced age. She has a LBBB on EKG with no old EKG to compare to. Her echo shows severe LV dysfunction with EF 25-30% but trop is neg x 3. I suspect this is more likely to be tachycardia  mediated. Given her advanced age would pursue a more conservative approach and not pursue invasive ischemic  workup of possible CAD.  3. SOB with acute systolic CHF. Several days duration with cough but no fever or chills. CXR with ? Bibasilar PNA. BNP elevated consistent with acute CHF. Echo with severe LV dysfunction so suspect there is a component of acute systolic CHFalthough CXR not consistent with that. BNP has increased further. Still has crackles at bases. Increased lasix to 40mg  IV BID with 2.6L UOP yesterday.  She is net neg 4.1L but weight the same.  Renal function improved today and stable. Will hold on adding ACE I so that we have enough BP for uptitrating BB for rate control and diuresing.    4. Leg heaviness and weaknesswith frequent falls of unknown etiology. She says that she has not passed out although she has a history of neurocardiogenic syncope on her PMH list.   5. Hyponatremialikely related to CHF. Improved slightly with diuresis but now slightly down again today.  Will follow.    6. Moderate AR and mild MS on  echo    Armanda Magicraci Valeen Borys, MD  08/31/2015  9:23 AM

## 2015-08-31 NOTE — Progress Notes (Signed)
Prior to HS, Pt heart rate 98 with increasing to 130s when OOB.  Pt denied c/o with increased heart rate.  HS meds administered including 50 mg metoprolol and 200 mg amiodarone.  Pt heart rate in 80-90s while in bed resting.  Will continue to monitor closely.

## 2015-09-01 DIAGNOSIS — R071 Chest pain on breathing: Secondary | ICD-10-CM

## 2015-09-01 LAB — PROCALCITONIN: Procalcitonin: <0.1 ng/mL

## 2015-09-01 LAB — BASIC METABOLIC PANEL
ANION GAP: 10 (ref 5–15)
BUN: 13 mg/dL (ref 6–20)
CO2: 32 mmol/L (ref 22–32)
Calcium: 9.1 mg/dL (ref 8.9–10.3)
Chloride: 86 mmol/L — ABNORMAL LOW (ref 101–111)
Creatinine, Ser: 1.39 mg/dL — ABNORMAL HIGH (ref 0.44–1.00)
GFR calc Af Amer: 37 mL/min — ABNORMAL LOW (ref >60)
GFR calc non Af Amer: 32 mL/min — ABNORMAL LOW (ref >60)
GLUCOSE: 97 mg/dL (ref 65–99)
POTASSIUM: 4.1 mmol/L (ref 3.5–5.1)
Sodium: 128 mmol/L — ABNORMAL LOW (ref 135–145)

## 2015-09-01 LAB — MAGNESIUM: Magnesium: 1.8 mg/dL (ref 1.7–2.4)

## 2015-09-01 MED ORDER — FUROSEMIDE 40 MG PO TABS
40.0000 mg | ORAL_TABLET | Freq: Every day | ORAL | Status: DC
Start: 2015-09-02 — End: 2015-09-02
  Administered 2015-09-02: 40 mg via ORAL
  Filled 2015-09-01: qty 1

## 2015-09-01 NOTE — Care Management Important Message (Signed)
Important Message  Patient Details  Name: [name] [name] MRN: [id number] Date of Birth: 1923-01-28   Medicare Important Message Given:  Yes    [name] [name] 09/01/2015, 11:14 AM

## 2015-09-01 NOTE — Evaluation (Signed)
Occupational Therapy Evaluation Patient Details Name: Leandro ReasonerMarion McKeon Taylor Blackburn MRN: [id number] DOB: March 01, 1923 Today's Date: 09/01/2015    History of Present Illness 82 y.o. F presented with chest pain and bilateral leg weakness on 8/16. EKG revealed A Fib/flutter and RVR. Chest x-ray showed atelectasis.  PMHx: Afib, hypotension.   Clinical Impression   This 82 yo female admitted with above presents to acute OT with deficits below (see OT problem list) thus affecting her PLOF of independent with basic ADLs and most IADLs at home. She will benefit from acute OT with follow up HHOT to get back to her PLOF in her own environment. The lowest her sats were on RA was 95%.  Follow Up Recommendations  Home health OT    Equipment Recommendations   (none)       Precautions / Restrictions Precautions Precautions: Fall Restrictions Weight Bearing Restrictions: No      Mobility Bed Mobility Overal bed mobility: Modified Independent             General bed mobility comments: use of rail and HOB up  Transfers Overall transfer level: Needs assistance Equipment used: None Transfers: Sit to/from Stand Sit to Stand: Supervision         General transfer comment: minguard A for ambulation in room and around unit circle    Balance Overall balance assessment: Needs assistance Sitting-balance support: No upper extremity supported;Feet supported Sitting balance-Leahy Scale: Good Sitting balance - Comments: able to sit EOB and don socks without LOB   Standing balance support: No upper extremity supported;During functional activity Standing balance-Leahy Scale: Poor Standing balance comment: pt with 1 LOB as she was ambulating to bathroom where she had to do a crossover step to catch herself and I assisted as well due to I was not sure she was going to be able to self correct'                            ADL Overall ADL's : Needs assistance/impaired Eating/Feeding:  Independent;Sitting   Grooming: Supervision/safety;Standing;Set up   Upper Body Bathing: Supervision/ safety;Sitting;Standing;Set up (sponge bath)   Lower Body Bathing: Set up;Supervison/ safety;Sit to/from stand (sponge bath)   Upper Body Dressing : Supervision/safety;Set up;Sitting   Lower Body Dressing: Set up;Supervision/safety;Sit to/from stand   Toilet Transfer: Min guard;Ambulation;BSC Toilet Transfer Details (indicate cue type and reason): over toilet, no AD for ambulation Toileting- Clothing Manipulation and Hygiene: Supervision/safety;Sit to/from stand         General ADL Comments: I have advised pt to either do a sponge bath at sink or if she does showe to use the shower seat she has (normally she stands to shower)               Pertinent Vitals/Pain Pain Assessment: No/denies pain     Hand Dominance Right   Extremity/Trunk Assessment Upper Extremity Assessment Upper Extremity Assessment: Generalized weakness           Communication Communication Communication: No difficulties   Cognition Arousal/Alertness: Awake/alert Behavior During Therapy: WFL for tasks assessed/performed Overall Cognitive Status: Within Functional Limits for tasks assessed                                Home Living Family/patient expects to be discharged to:: Private residence Living Arrangements: Spouse/significant other Available Help at Discharge: Family;Available 24 hours/day Type of Home: House Home Access: Level entry  Home Layout: One level     Bathroom Shower/Tub: Walk-in shower;Door   Bathroom Toilet: StandFoot Lockerard     Home Equipment: Grab bars - tub/shower;Shower seat;Hand held shower head          Prior Functioning/Environment Level of Independence: Independent        Comments: drives    OT Diagnosis: Generalized weakness   OT Problem List: Impaired balance (sitting and/or standing)   OT Treatment/Interventions: Self-care/ADL  training;Balance training;Patient/family education;DME and/or AE instruction    OT Goals(Current goals can be found in the care plan section) Acute Rehab OT Goals Patient Stated Goal: to go home OT Goal Formulation: With patient Time For Goal Achievement: 09/08/15 Potential to Achieve Goals: Good  OT Frequency: Min 2X/week              End of Session Equipment Utilized During Treatment: Gait belt (none) Nurse Communication:  (Rn (Ray) saw pt ambulating in hallway with me)  Activity Tolerance: Patient tolerated treatment well Patient left: in chair;with call bell/phone within reach;with chair alarm set;with family/visitor present   Time: 1540-1600 OT Time Calculation (min): 20 min Charges:  OT General Charges $OT Visit: 1 Procedure OT Evaluation $OT Eval Moderate Complexity: 1 Procedure  Evette GeorgesLeonard, Meliya Mcconahy Eva [phone] 09/01/2015, 4:12 PM

## 2015-09-01 NOTE — Progress Notes (Signed)
[facility] PROGRESS NOTE  Leandro ReasonerMarion McKeon Taylor Alberg ZOX:[id number] DOB: [birth year] DOA: 08/27/2015   PCP: Ignatius Speckinghruv B Vyas, MD     Assessment/Plan: Principal Problem:   Chest pain Active Problems:   Atrial fibrillation with RVR (HCC)   Cough   Hypothyroidism   Hyponatremia   Bilateral leg weakness   Chest pain at rest   SOB (shortness of breath)   DCM (dilated cardiomyopathy) (HCC)   Acute systolic CHF (congestive heart failure) (HCC)    82yo WF with a history of chronic atrial fibrillation not on chronic anticoagulation (patient does not know why but most likely due to frequent falls and advanced age), diverticulosis with chronic fecal incontinence, GERD, anxiety, arthritis, ? Pancreatic tumor (per medical records from [facility]) and hypotension.She was found to be in atrial flutter with RVR and was given Cardizem and started on a drip  Assessment and plan  Atrial fibrillation with RVR , heart rate continues to be in the 130s - Cardiology following, troponin 0.03,<0.03 - Was on diltiazem infusion at outside hospital, received Cardizem push and by mouth Cardizem which was discontinued due to low blood pressure She has not been on chronic anticoagulation in the past   due to frequent falls and advanced age. Her CHADS2VASC score is 4.   TSH 4.25, free T4 elevated at 1.21 Doubt pneumonia as pro-calcitonin negative - CHADS-VASc at least 874 (age x2, gender, HTN)  - [facility], presumably d/t falls  CTA  no PE, cardiomegaly, moderate-sized bilateral pleural effusions, no pneumonia Discontinued CCB due to LV dysfunction and increase BB as BP tolerates Metoprolol increased to 75 mg twice a day,.    Would avoid starting daily dig given her advanced age and reduced kidney function Cardiology also started patient on amiodarone HR was 100 Anticipate patient will be able to participate with physical therapy today   Chest pain  No evidence of pneumonia, has bilateral pleural  effusions - Troponins  remained negative, 2-D echo shows EF of 25-30%, diffuse hypokinesis, may need a cardiac cath, deferred to cardiology - Given ASA 324 at outside hospital , currently on aspirin 81 mg a day   Acute on chronic systolic congestive heart failure-tachycardia mediated Currently on IV Lasix, started on Lasix 40 mg by mouth daily Renal function stable Will hold on adding ACE I so that we have enough BP for uptitrating BB for rate control and diuresing    Leg weakness, likely secondary to all of the above - Uncertain etiology, likely multifactorial  - Hyponatremia may be contributing and will be managed as below  - No back pain or numbness/tingling to suggest a cord-compression or radiculopathy - PT eval tomorrow    Hyponatremia  - Serum sodium 127 at outside hospital , now 128 , likely secondary to volume overload, hopefully should improve with diuresis - SLIV for now and follow daily BMP  - CXR, BNP 768.9,  could be secondary to  congestive heart failure,      Hypothyroidism  - Recently started on Synthroid 25 mcg - TSH and free T4 at outside hospital are both minimally elevated  - Continue current-dose Synthroid for now, though would be reasonable to hold in setting of a fib RVR without clear precipitant    Cough  - CXR at outside hospital reported as bibasilar opacities, atelectasis vs PNA  - There is no fever or leukocytosis and cough is not productive    CT chest on 8/17 did not show  any evidence of pneumonia   DVT prophylaxsis heparin  Code Status:  Full code     Family Communication: Discussed in detail with the patient, all imaging results, lab results explained to the patient   Disposition Plan: Disposition as per cardiology, PT eval      Consultants:  Cardiology  Procedures:  None  Antibiotics: Anti-infectives    None         HPI/Subjective:  Non productive cough. No palpitations, resting dyspnea, or dizziness.    Objective: Vitals:   08/31/15 2002 09/01/15 0255 09/01/15 0544 09/01/15 0842  BP: (!) 145/98  134/73 120/73  Pulse: (!) 111   (!) 119  Resp: 18  16   Temp: 98.5 F (36.9 C)  98.1 F (36.7 C)   TempSrc: Oral  Oral   SpO2: 96%  97%   Weight:  67.7 kg (149 lb 4.8 oz)    Height:        Intake/Output Summary (Last 24 hours) at 09/01/15 1132 Last data filed at 09/01/15 0947  Gross per 24 hour  Intake              480 ml  Output             1600 ml  Net            -1120 ml    Exam:  Examination:  General exam: Appears calm and comfortable  Respiratory system: Clear to auscultation. Respiratory effort normal. Cardiovascular system: S1 & S2 heard, RRR. No JVD, murmurs, rubs, gallops or clicks. No pedal edema. Gastrointestinal system: Abdomen is nondistended, soft and nontender. No organomegaly or masses felt. Normal bowel sounds heard. Central nervous system: Alert and oriented. No focal neurological deficits. Extremities: Symmetric 5 x 5 power. Skin: No rashes, lesions or ulcers Psychiatry: Judgement and insight appear normal. Mood & affect appropriate.     Data Reviewed: I have personally reviewed following labs and imaging studies  Micro Results Recent Results (from the past 240 hour(s))  MRSA PCR Screening     Status: None   Collection Time: 08/27/15 10:35 PM  Result Value Ref Range Status   MRSA by PCR NEGATIVE NEGATIVE Final    Comment:        The GeneXpert MRSA Assay (FDA approved for NASAL specimens only), is one component of a comprehensive MRSA colonization surveillance program. It is not intended to diagnose MRSA infection nor to guide or monitor treatment for MRSA infections.     Radiology Reports Ct Angio Chest Pe W Or Wo Contrast  Result Date: 08/28/2015 CLINICAL DATA:  Shortness of breath for few days, episodes chest pain. EXAM: CT ANGIOGRAPHY CHEST WITH CONTRAST TECHNIQUE: Multidetector CT imaging of the chest was performed using the standard  protocol during bolus administration of intravenous contrast. Multiplanar CT image reconstructions and MIPs were obtained to evaluate the vascular anatomy. CONTRAST:  80 cc Isovue 370 COMPARISON:  Chest x-ray from earlier same day and chest CT dated 02/12/2011. FINDINGS: Mediastinum/Lymph Nodes: Some of the most peripheral segmental and subsegmental pulmonary arteries are difficult to definitively characterize due to patient breathing motion artifact but there is no pulmonary embolism identified within the main, lobar or central segmental pulmonary arteries bilaterally. Atherosclerotic changes along the walls of the normal-caliber thoracic aorta. No aortic aneurysm. Heart is enlarged. No pericardial effusion. No mass or enlarged lymph nodes seen within the mediastinum or perihilar regions. Esophagus is unremarkable. Trachea appears normal. Lungs/Pleura: Interstitial and peribronchial thickening at the lung bases suggesting  edema. Bilateral pleural effusions, moderate in size, right slightly greater than left. Associated mild bibasilar atelectasis. No evidence of pneumonia. No pneumothorax seen. Upper abdomen: Limited images of the upper abdomen are unremarkable. Musculoskeletal: Degenerative changes and ankylosis throughout the kyphotic thoracic spine. Mild chronic compression deformity of the T12 vertebral body. Additional degenerative changes at both shoulders, right greater than left. No acute or suspicious osseous finding. Superficial soft tissues are unremarkable. Review of the MIP images confirms the above findings. IMPRESSION: 1. No pulmonary embolism, with mild study limitations detailed above. 2. Cardiomegaly. 3. Bibasilar edema suggesting CHF. 4. Moderate-sized pleural effusions bilaterally, right slightly greater than left, with associated mild bibasilar atelectasis. 5. No evidence of pneumonia. 6. Aortic atherosclerosis. Electronically Signed   By: Bary Richard M.D.   On: 08/28/2015 12:12   Dg Chest  Port 1 View  Result Date: 08/28/2015 CLINICAL DATA:  Cough and chest pain EXAM: PORTABLE CHEST 1 VIEW COMPARISON:  08/27/2015 FINDINGS: Cardiac shadow is again mildly enlarged. Aortic calcifications are again seen. The lungs are well aerated with slight increasing infiltrate at the bases particularly on the right. No bony abnormality is seen. IMPRESSION: Increasing bibasilar changes particularly on the right. Electronically Signed   By: Alcide Clever M.D.   On: 08/28/2015 09:21     CBC  Recent Labs Lab 08/28/15 0148 08/29/15 0500 08/30/15 0341 08/31/15 0400  WBC 6.7 7.8 6.5 6.1  HGB 11.4* 11.6* 11.3* 11.2*  HCT 34.1* 35.0* 33.5* 33.6*  PLT 193 191 171 208  MCV 93.9 93.8 93.8 93.6  MCH 31.4 31.1 31.7 31.2  MCHC 33.4 33.1 33.7 33.3  RDW 11.9 12.0 12.0 12.0  LYMPHSABS 1.2  --   --   --   MONOABS 0.5  --   --   --   EOSABS 0.0  --   --   --   BASOSABS 0.0  --   --   --     Chemistries   Recent Labs Lab 08/28/15 0148 08/29/15 0500 08/30/15 0341 08/30/15 1008 08/31/15 0400  NA 128* 128* 130*  --  128*  K 4.5 4.4 3.3*  --  4.0  CL 99* 95* 93*  --  87*  CO2 26 23 28   --  28  GLUCOSE 123* 99 90  --  97  BUN 11 12 14   --  11  CREATININE 1.20* 1.10* 1.23*  --  1.13*  CALCIUM 8.2* 8.6* 8.4*  --  8.7*  MG 2.0  --   --  1.6*  --   AST 30 29 28   --  25  ALT 27 30 30   --  30  ALKPHOS 64 71 70  --  73  BILITOT 0.6 0.6 0.6  --  0.4   ------------------------------------------------------------------------------------------------------------------ estimated creatinine clearance is 29.1 mL/min (by C-G formula based on SCr of 1.13 mg/dL). ------------------------------------------------------------------------------------------------------------------ No results for input(s): HGBA1C in the last 72 hours. ------------------------------------------------------------------------------------------------------------------ No results for input(s): CHOL, HDL, LDLCALC, TRIG, CHOLHDL,  LDLDIRECT in the last 72 hours. ------------------------------------------------------------------------------------------------------------------ No results for input(s): TSH, T4TOTAL, T3FREE, THYROIDAB in the last 72 hours.  Invalid input(s): FREET3 ------------------------------------------------------------------------------------------------------------------ No results for input(s): VITAMINB12, FOLATE, FERRITIN, TIBC, IRON, RETICCTPCT in the last 72 hours.  Coagulation profile  Recent Labs Lab 08/28/15 0148  INR 1.27    No results for input(s): DDIMER in the last 72 hours.  Cardiac Enzymes  Recent Labs Lab 08/28/15 0015 08/28/15 0532 08/28/15 1301  TROPONINI 0.03* <0.03 <0.03   ------------------------------------------------------------------------------------------------------------------ Invalid input(s):  POCBNP   CBG: No results for input(s): GLUCAP in the last 168 hours.     Studies: No results found.    No results found for: HGBA1C Lab Results  Component Value Date   CREATININE 1.13 (H) 08/31/2015       Scheduled Meds: . amiodarone  200 mg Oral BID  . aspirin EC  81 mg Oral Daily  . calcium carbonate  1 tablet Oral TID WC  . docusate sodium  100 mg Oral BID  . fesoterodine  4 mg Oral Daily  . [START ON 09/02/2015] furosemide  40 mg Oral Daily  . heparin  5,000 Units Subcutaneous Q8H  . levothyroxine  25 mcg Oral QAC breakfast  . metoprolol tartrate  75 mg Oral BID  . polyethylene glycol  17 g Oral Once  . sodium chloride flush  3 mL Intravenous Q12H   Continuous Infusions:    LOS: 5 days    Time spent: >30 MINS    [facility],Surina Storts  [facility] Pager [phone]. If 7PM-7AM, please contact night-coverage at [email or web address], password [email or web address] 09/01/2015, 11:32 AM  LOS: 5 days

## 2015-09-01 NOTE — Care Management (Addendum)
Case Management Note Initial Note Started by Letha Capeeborah Taylor, RN,BSN  Patient Details  Name: [name] [name] MRN: [id number] Date of Birth: [birth year]  Subjective/Objective:    NCM spoke with patient she lives with her spouse, she has a PCP Dr. Gardenia PhlegmBjyas, she has insurance to cover her medications, and her spouse will provide transport at dc for her.  [facility] , HH[facility]/HHaide.  Soc will begin 24- 48 hrs post discharge.  NCM will cont to follow for dc  Needs.                 Action/Plan:   Expected Discharge Date:                                   Expected Discharge Plan:  Home w Home Health Services  In-House Referral:  N/A   Discharge planning Services  CM Consult  Post Acute Care Choice: Home with Home Health Services   Choice offered to:  Patient  DME Arranged:   N/A DME Agency: N/A    HH Arranged:  RN, PT, Nurse's Aide HH Agency:  [facility]  Status of Service:  Completed If discussed at Long Length of Stay Meetings, dates discussed:    Additional Comments:  1055 09-02-15 Tomi BambergerBrenda Graves-Bigelow, RN,BSN [phone] CM did speak with pt in regards to disposition needs. [facility] SOC to begin within 24-48 hours of d/c. Per pt she will get DME Rollator from a family friend. No further needs from CM at this time.   1445 09-01-15 Tomi BambergerBrenda Graves-Bigelow, RN,BSN [phone] CM did speak with pt in regards to disposition needs. Pt would like a Rollator: RW with seat. Per pt she has a friend she can get it cheaper through. CM to check with pt in am. [facility] once [facility] for [facility]/c.

## 2015-09-01 NOTE — Progress Notes (Signed)
Physical Therapy Treatment Patient Details Name: [name] [name] MRN: [id number] DOB: [birth year] Today's Date: 09/01/2015    History of Present Illness 82 y.o. F presented with chest pain and bilateral leg weakness on 8/16. EKG revealed A Fib/flutter and RVR. Chest x-ray showed atelectasis.  PMHx: Afib, hypotension.    PT Comments    Pt is progressing in mobility and ambulation. Pt fatigued during  walking and went from min guard to one person hha. Pt's HR increased to 113 during ambulation but her Sp02 was > 93% throughout treatment. Pt is still appropriate for d/c to HHPT.    Follow Up Recommendations  Home health PT;Supervision/Assistance - 24 hour;Other (comment)     Equipment Recommendations  Rolling walker with 5" wheels    Recommendations for Other Services       Precautions / Restrictions Precautions Precautions: Fall Restrictions Weight Bearing Restrictions: No    Mobility  Bed Mobility               General bed mobility comments: Pt was sitting at EOB   Transfers Overall transfer level: Needs assistance Equipment used: None Transfers: Sit to/from Stand Sit to Stand: Min guard;Supervision (min guard from bed, supervision from toilet )         General transfer comment: min guard to sit to stand from EOB, and supervision from toilet to stand. Increased time to perform both transfers   Ambulation/Gait Ambulation/Gait assistance: Min guard;Min assist (guard to start, min assist with fatigue ) Ambulation Distance (Feet): 50 Feet Assistive device: 1 person hand held assist Gait Pattern/deviations: Step-through pattern;Decreased stride length;Drifts right/left Gait velocity: decreased Gait velocity interpretation: Below normal speed for age/gender General Gait Details: Pt ambulated 50 feet starting at min guard and as she fatigued needed min assist through hha. Pt 's HR increased to 113 with ambulation and her SP02 was greater than 93% .  Pt would  drift to the left when walking.    Stairs            Wheelchair Mobility    Modified Rankin (Stroke Patients Only)       Balance Overall balance assessment: Needs assistance Sitting-balance support: No upper extremity supported;Feet supported Sitting balance-Leahy Scale: Fair Sitting balance - Comments:  Pt could sit EOB and maintain blance without UE support    Standing balance support: No upper extremity supported;During functional activity Standing balance-Leahy Scale: Fair Standing balance comment: Pt able to maintain balance and perform dynamic functional tasks at the sink                    Cognition Arousal/Alertness: Awake/alert Behavior During Therapy: WFL for tasks assessed/performed Overall Cognitive Status: Within Functional Limits for tasks assessed                      Exercises      General Comments General comments (skin integrity, edema, etc.): Pt ambulated 15 feet to the bathroom and performed pericare without physical assist       Pertinent Vitals/Pain Pain Assessment: No/denies pain    Home Living                      Prior Function            PT Goals (current goals can now be found in the care plan section) Acute Rehab PT Goals Patient Stated Goal: to go home PT Goal Formulation: With patient Time For Goal Achievement: 09/11/15 Potential  to Achieve Goals: Good Progress towards PT goals: Progressing toward goals    Frequency  Min 3X/week    PT Plan Current plan remains appropriate    Co-evaluation             End of Session Equipment Utilized During Treatment: Gait belt Activity Tolerance: Patient limited by fatigue Patient left: in chair;with call bell/phone within reach;with chair alarm set     Time: 1115-1134 PT Time Calculation (min) (ACUTE ONLY): 19 min  Charges:  $Gait Training: 8-22 mins                    G Codes:      Anacarolina Evelyn 09/01/2015, 1:43 PM Chyrel MassonMeredith Corey Laski, SPT

## 2015-09-01 NOTE — Progress Notes (Signed)
Patient Profile: Very elderly, frail 82 y/o WF with a history of chronic atrial fibrillation/flutter not on chronic anticoagulation (patient does not know why but most likely due to frequent falls and advanced age), diverticulosis with chronic fecal incontinence, GERD, anxiety, arthritis, ? Pancreatic tumor (per medical records from [facility]) and hypotension, admitted for symptomatic atrial flutter w/ RVR and acute CHF.   Subjective: No major complaints. Reading a book. Ready for her physical therapy to start. Only has mild chest discomfort when she coughs. Non productive cough. No palpitations, resting dyspnea, or dizziness.   Objective: Vital signs in last 24 hours: Temp:  [98.1 F (36.7 C)-98.5 F (36.9 C)] 98.1 F (36.7 C) (08/21 0544) Pulse Rate:  [78-119] 119 (08/21 0842) Resp:  [16-18] 16 (08/21 0544) BP: (120-145)/(73-101) 120/73 (08/21 0842) SpO2:  [93 %-97 %] 97 % (08/21 0544) Weight:  [149 lb 4.8 oz (67.7 kg)] 149 lb 4.8 oz (67.7 kg) (08/21 0255) Last BM Date: 08/31/15  Intake/Output from previous day: 08/20 0701 - 08/21 0700 In: 240 [P.O.:240] Out: 1600 [Urine:1600] Intake/Output this shift: Total I/O In: 240 [P.O.:240] Out: 300 [Stool:300]  Medications Current Facility-Administered Medications  Medication Dose Route Frequency Provider Last Rate Last Dose  . amiodarone (PACERONE) tablet 200 mg  200 mg Oral BID Quintella Reichert, MD   200 mg at 09/01/15 0847  . aspirin EC tablet 81 mg  81 mg Oral Daily Briscoe Deutscher, MD   81 mg at 09/01/15 0842  . calcium carbonate (TUMS - dosed in mg elemental calcium) chewable tablet 200 mg of elemental calcium  1 tablet Oral TID WC Richarda Overlie, MD   200 mg of elemental calcium at 09/01/15 0842  . docusate sodium (COLACE) capsule 100 mg  100 mg Oral BID Lonia Skinner [place], PA-C   100 mg at 08/31/15 2225  . fesoterodine (TOVIAZ) tablet 4 mg  4 mg Oral Daily Briscoe Deutscher, MD   4 mg at 09/01/15 0547  . furosemide (LASIX) injection 40  mg  40 mg Intravenous BID Richarda Overlie, MD   40 mg at 09/01/15 0847  . heparin injection 5,000 Units  5,000 Units Subcutaneous Q8H Briscoe Deutscher, MD   5,000 Units at 09/01/15 0547  . levothyroxine (SYNTHROID, LEVOTHROID) tablet 25 mcg  25 mcg Oral QAC breakfast Briscoe Deutscher, MD   25 mcg at 09/01/15 0546  . metoprolol tartrate (LOPRESSOR) tablet 75 mg  75 mg Oral BID Quintella Reichert, MD   75 mg at 09/01/15 0842  . nitroGLYCERIN (NITROSTAT) SL tablet 0.4 mg  0.4 mg Sublingual Q5 Min x 3 PRN Briscoe Deutscher, MD      . ondansetron (ZOFRAN) injection 4 mg  4 mg Intravenous Q6H PRN Lavone Neri Opyd, MD      . polyethylene glycol (MIRALAX / GLYCOLAX) packet 17 g  17 g Oral Once Elson Areas, PA-C      . sodium chloride flush (NS) 0.9 % injection 3 mL  3 mL Intravenous Q12H Briscoe Deutscher, MD   3 mL at 08/31/15 2227  . sodium chloride flush (NS) 0.9 % injection 3 mL  3 mL Intravenous PRN Briscoe Deutscher, MD        PE: General appearance: alert, cooperative, no distress and elderly Neck: no carotid bruit and no JVD Lungs: clear to auscultation bilaterally Heart: irregularly irregular rhythm and regular rate Extremities: no LEE Pulses: 2+ and symmetric Skin: warm and drym and dry Neurologic: grossly  normal  Lab Results:   Recent Labs  08/30/15 0341 08/31/15 0400  WBC 6.5 6.1  HGB 11.3* 11.2*  HCT 33.5* 33.6*  PLT 171 208   BMET  Recent Labs  08/30/15 0341 08/31/15 0400  NA 130* 128*  K 3.3* 4.0  CL 93* 87*  CO2 28 28  GLUCOSE 90 97  BUN 14 11  CREATININE 1.23* 1.13*  CALCIUM 8.4* 8.7*   Cardiac Panel (last 3 results) No results for input(s): CKTOTAL, CKMB, TROPONINI, RELINDX in the last 72 hours.  Studies/Results: 2D echo 08/28/15 Study Conclusions  - Left ventricle: The cavity size was normal. There was moderate   concentric hypertrophy. Systolic function was severely reduced.   The estimated ejection fraction was in the range of 25% to 30%.   Diffuse hypokinesis. -  Aortic valve: There was moderate regurgitation. Regurgitation   pressure half-time: 262 ms. - Mitral valve: Severely calcified annulus, mostly posterior.   Mobility was restricted. The findings are consistent with mild   stenosis. Mean gradient (D): 3 mm Hg. - Right ventricle: The cavity size was normal. Wall thickness was   normal. Systolic function was normal. - Atrial septum: No defect or patent foramen ovale was identified. - Tricuspid valve: There was mild regurgitation. - Pulmonary arteries: Systolic pressure was within the normal   range. PA peak pressure: 37 mm Hg (S).   Assessment/Plan  Principal Problem:   Chest pain Active Problems:   Atrial fibrillation with RVR (HCC)   Cough   Hypothyroidism   Hyponatremia   Bilateral leg weakness   Chest pain at rest   SOB (shortness of breath)   DCM (dilated cardiomyopathy) (HCC)   Acute systolic CHF (congestive heart failure) (HCC)   1. Chronic atrial flutter/atrial fibrillation: HR is improved in the 80s-low 100s resting. She is asymptomatic at rest. Patient did not tolerate IV Cardizem given hypotension. Not a candidate for long term PO Cardizem given LV dysfunction. She is on BB for rate control + PO amiodarone. Avoiding digoxin given renal function and advanced age. Her CHA2DS2 VASc score is 4, however she is not on anticoagulation given advanced age, and high fall risk. K was stable yesterday. Will check BMP today given she is being diuresed with lasix. Ambulate with physical therapy to see how high her HR fluctuates when she ambulates. Will further adjust amio +/- lopressor as needed.    2. Chest pain: somewhat atypical and worse with laying flat and when coughing. She does have a chest xray with ? Bibasilar PNA. However lungs sound clear on exam today.  Cardiac enzymes are negative x 3. She has a LBBB on EKG with no old EKG to compare to. Her echo shows severe LV dysfunction with EF 25-30% (likely tachy mediated from rapid  afib). Given advanced age, plan is for conservative management. No plans for invasive procedures at this time. Continue medical management.   3. SOB with acute systolic CHF: EF 82-95%25-30%. She has diuresed 5.5L since admit. -1.6 L out in past 24 hrs. No resting dyspnea. Lung exam is CTAB. Will observe how well she does ambulating with PT today. Continue BB therapy. ? Switch to long acting metoprolol vs coreg given LV dysfunction. Will need to try adding low dose ACE/ARB further down the road if BP and renal function tolerates. Low sodium diet + daily weights.   4. AI: moderate regurgitation noted on echo. No stenosis.     LOS: 5 days    Brittainy M. Sharol HarnessSimmons, PA-C  09/01/2015 10:06 AM   Attending Note:   The patient was seen and examined.  Agree with assessment and plan as noted above.  Changes made to the above note as needed.  Patient seen and independently examined with Robbie LisBrittainy Simmons, PA .   We discussed all aspects of the encounter. I agree with the assessment and plan as stated above.  Examined patient as she was finishing her walk She did well on the walk, sats remains good HR was 100 She appears to be comfortable  Will change the IV lasix to PO Lasix 40 mg daily. We can increase as needed but I would start with this dose  I think she is doing great for [id number] years old and can be considered for DC soon - perhaps today or tomorrow  Follow up with Dr. Mayford Knifeurner in the office    I have spent a total of 25 minutes with patient reviewing hospital  notes , telemetry, EKGs, labs and examining patient as well as establishing an assessment and plan that was discussed with the patient. > 50% of time was spent in direct patient care.    Vesta MixerPhilip J. Nahser, Montez HagemanJr., MD, [facility] 09/01/2015, 11:28 AM [address],  [address] Office [phone] Pager [phone]

## 2015-09-02 MED ORDER — FUROSEMIDE 40 MG PO TABS: 40.0000 mg | ORAL_TABLET | Freq: Every day | ORAL | 2 refills | Status: DC

## 2015-09-02 MED ORDER — ROLLER WALKER MISC: 0 refills | Status: DC

## 2015-09-02 MED ORDER — AMIODARONE HCL 200 MG PO TABS: 200.0000 mg | ORAL_TABLET | Freq: Two times a day (BID) | ORAL | 1 refills | Status: DC

## 2015-09-02 MED ORDER — METOPROLOL TARTRATE 75 MG PO TABS: 75.0000 mg | ORAL_TABLET | Freq: Two times a day (BID) | ORAL | 3 refills | Status: DC

## 2015-09-02 NOTE — Discharge Summary (Signed)
Physician Discharge Summary  [name] [name] MRN: [id number] DOB/AGE: 17-Mar-1923 82 y.o.  PCP: [name] Chroman, MD   Admit date: 08/27/2015 Discharge date: 09/02/2015  Discharge Diagnoses:    Principal Problem:   Chest pain Active Problems:   Atrial fibrillation with RVR (HCC)   Cough   Hypothyroidism   Hyponatremia   Bilateral leg weakness   Chest pain at rest   SOB (shortness of breath)   DCM (dilated cardiomyopathy) (HCC)   Acute systolic CHF (congestive heart failure) ([place])    Follow-up recommendations Follow-up with PCP in 3-5 days , including all  additional recommended appointments as below Follow-up CBC, CMP in 3-5 days Patient to follow-up with [name] Recommend repeating TSH and free T4 in the next 4-6 weeks     Current Discharge Medication List    START taking these medications   Details  amiodarone (PACERONE) 200 MG tablet Take 1 tablet (200 mg total) by mouth 2 (two) times daily. Qty: 60 tablet, Refills: 1    furosemide (LASIX) 40 MG tablet Take 1 tablet (40 mg total) by mouth daily. Qty: 30 tablet, Refills: 2      CONTINUE these medications which have CHANGED   Details  Metoprolol Tartrate 75 MG TABS Take 75 mg by mouth 2 (two) times daily. Qty: 60 tablet, Refills: 3      CONTINUE these medications which have NOT CHANGED   Details  aspirin EC 81 MG tablet Take 81 mg by mouth daily.    fesoterodine (TOVIAZ) 4 MG TB24 tablet Take 4 mg by mouth.    levothyroxine (SYNTHROID, LEVOTHROID) 25 MCG tablet Take 25 mcg by mouth daily before breakfast.         Discharge Condition: *Stable Discharge Instructions Get Medicines reviewed and adjusted: Please take all your medications with you for your next visit with your Primary MD  Please request your Primary MD to go over all hospital tests and procedure/radiological results at the follow up, please ask your Primary MD to get all Hospital records sent to his/her office.  If you  experience worsening of your admission symptoms, develop shortness of breath, life threatening emergency, suicidal or homicidal thoughts you must seek medical attention immediately by calling 911 or calling your MD immediately if symptoms less severe.  You must read complete instructions/literature along with all the possible adverse reactions/side effects for all the Medicines you take and that have been prescribed to you. Take any new Medicines after you have completely understood and accpet all the possible adverse reactions/side effects.   Do not drive when taking Pain medications.   Do not take more than prescribed Pain, Sleep and Anxiety Medications  Special Instructions: If you have smoked or chewed Tobacco in the last 2 yrs please stop smoking, stop any regular Alcohol and or any Recreational drug use.  Wear Seat belts while driving.  Please note  You were cared for by a hospitalist during your hospital stay. Once you are discharged, your primary care physician will handle any further medical issues. Please note that NO REFILLS for any discharge medications will be authorized once you are discharged, as it is imperative that you return to your primary care physician (or establish a relationship with a primary care physician if you do not have one) for your aftercare needs so that they can reassess your need for medications and monitor your lab values.  Discharge Instructions    Diet - low sodium heart healthy    Complete  by:  As directed   Increase activity slowly    Complete by:  As directed       Allergies  Allergen Reactions  . Acetaminophen Anaphylaxis  . Hydrocortisone Swelling    Eyes swell  . Nsaids Swelling    Per pt can take plain Aspirin  . Saccharin Other (See Comments)    [place]      Disposition: Home with home health   Consults Cardiology   Significant Diagnostic Studies:  Ct Angio Chest Pe W Or Wo Contrast  Result Date: 08/28/2015 CLINICAL  DATA:  Shortness of breath for few days, episodes chest pain. EXAM: CT ANGIOGRAPHY CHEST WITH CONTRAST TECHNIQUE: Multidetector CT imaging of the chest was performed using the standard protocol during bolus administration of intravenous contrast. Multiplanar CT image reconstructions and MIPs were obtained to evaluate the vascular anatomy. CONTRAST:  80 cc Isovue 370 COMPARISON:  Chest x-ray from earlier same day and chest CT dated 02/12/2011. FINDINGS: Mediastinum/Lymph Nodes: Some of the most peripheral segmental and subsegmental pulmonary arteries are difficult to definitively characterize due to patient breathing motion artifact but there is no pulmonary embolism identified within the main, lobar or central segmental pulmonary arteries bilaterally. Atherosclerotic changes along the walls of the normal-caliber thoracic aorta. No aortic aneurysm. Heart is enlarged. No pericardial effusion. No mass or enlarged lymph nodes seen within the mediastinum or perihilar regions. Esophagus is unremarkable. Trachea appears normal. Lungs/Pleura: Interstitial and peribronchial thickening at the lung bases suggesting edema. Bilateral pleural effusions, moderate in size, right slightly greater than left. Associated mild bibasilar atelectasis. No evidence of pneumonia. No pneumothorax seen. Upper abdomen: Limited images of the upper abdomen are unremarkable. Musculoskeletal: Degenerative changes and ankylosis throughout the kyphotic thoracic spine. Mild chronic compression deformity of the T12 vertebral body. Additional degenerative changes at both shoulders, right greater than left. No acute or suspicious osseous finding. Superficial soft tissues are unremarkable. Review of the MIP images confirms the above findings. IMPRESSION: 1. No pulmonary embolism, with mild study limitations detailed above. 2. Cardiomegaly. 3. Bibasilar edema suggesting CHF. 4. Moderate-sized pleural effusions bilaterally, right slightly greater than left,  with associated mild bibasilar atelectasis. 5. No evidence of pneumonia. 6. Aortic atherosclerosis. Electronically Signed   By: Franki Cabot M.D.   On: 08/28/2015 12:12   Dg Chest Port 1 View  Result Date: 08/28/2015 CLINICAL DATA:  Cough and chest pain EXAM: PORTABLE CHEST 1 VIEW COMPARISON:  08/27/2015 FINDINGS: Cardiac shadow is again mildly enlarged. Aortic calcifications are again seen. The lungs are well aerated with slight increasing infiltrate at the bases particularly on the right. No bony abnormality is seen. IMPRESSION: Increasing bibasilar changes particularly on the right. Electronically Signed   By: Inez Catalina M.D.   On: 08/28/2015 09:21    2-D echo LV EF: 25% -   30%  ------------------------------------------------------------------- History:   PMH:  LIMITED FOR EF. Hypothyroidism. Evaluate EF. Atrial fibrillation.  Atrial flutter.  Atrial flutter.  Angina pectoris.  Risk factors:  Hypertension.  ------------------------------------------------------------------- Study Conclusions  - Left ventricle: The cavity size was normal. There was moderate   concentric hypertrophy. Systolic function was severely reduced.   The estimated ejection fraction was in the range of 25% to 30%.   Diffuse hypokinesis. - Aortic valve: There was moderate regurgitation. Regurgitation   pressure half-time: 262 ms. - Mitral valve: Severely calcified annulus, mostly posterior.   Mobility was restricted. The findings are consistent with mild   stenosis. Mean gradient (D): 3 mm Hg. -  Right ventricle: The cavity size was normal. Wall thickness was   normal. Systolic function was normal. - Atrial septum: No defect or patent foramen ovale was identified. - Tricuspid valve: There was mild regurgitation. - Pulmonary arteries: Systolic pressure was within the normal   range. PA peak pressure: 37 mm Hg (S).    Filed Weights   08/31/15 0429 09/01/15 0255 09/02/15 0500  Weight: 68.7 kg (151 lb  6.4 oz) 67.7 kg (149 lb 4.8 oz) 67.2 kg (148 lb 1.6 oz)     Microbiology: Recent Results (from the past 240 hour(s))  MRSA PCR Screening     Status: None   Collection Time: 08/27/15 10:35 PM  Result Value Ref Range Status   MRSA by PCR NEGATIVE NEGATIVE Final    Comment:        The GeneXpert MRSA Assay (FDA approved for NASAL specimens only), is one component of a comprehensive MRSA colonization surveillance program. It is not intended to diagnose MRSA infection nor to guide or monitor treatment for MRSA infections.        Blood Culture No results found for: SDES, [place], CULT, REPTSTATUS    Labs: Results for orders placed or performed during the hospital encounter of 08/27/15 (from the past 48 hour(s))  Procalcitonin     Status: None   Collection Time: 09/01/15  5:32 AM  Result Value Ref Range   Procalcitonin <0.10 ng/mL    Comment:        Interpretation: PCT (Procalcitonin) <= 0.5 ng/mL: Systemic infection (sepsis) is not likely. Local bacterial infection is possible. (NOTE)         ICU PCT Algorithm               Non ICU PCT Algorithm    ----------------------------     ------------------------------         PCT < 0.25 ng/mL                 PCT < 0.1 ng/mL     Stopping of antibiotics            Stopping of antibiotics       strongly encouraged.               strongly encouraged.    ----------------------------     ------------------------------       PCT level decrease by               PCT < 0.25 ng/mL       >= 80% from peak PCT       OR PCT 0.25 - 0.5 ng/mL          Stopping of antibiotics                                             encouraged.     Stopping of antibiotics           encouraged.    ----------------------------     ------------------------------       PCT level decrease by              PCT >= 0.25 ng/mL       < 80% from peak PCT        AND PCT >= 0.5 ng/mL            Continuin g antibiotics  encouraged.       Continuing antibiotics            encouraged.    ----------------------------     ------------------------------     PCT level increase compared          PCT > 0.5 ng/mL         with peak PCT AND          PCT >= 0.5 ng/mL             Escalation of antibiotics                                          strongly encouraged.      Escalation of antibiotics        strongly encouraged.   Magnesium     Status: None   Collection Time: 09/01/15  5:32 AM  Result Value Ref Range   Magnesium 1.8 1.7 - 2.4 mg/dL  Basic metabolic panel     Status: Abnormal   Collection Time: 09/01/15  5:32 AM  Result Value Ref Range   Sodium 128 (L) 135 - 145 mmol/L   Potassium 4.1 3.5 - 5.1 mmol/L   Chloride 86 (L) 101 - 111 mmol/L   CO2 32 22 - 32 mmol/L   Glucose, Bld 97 65 - 99 mg/dL   BUN 13 6 - 20 mg/dL   Creatinine, Ser 1.39 (H) 0.44 - 1.00 mg/dL   Calcium 9.1 8.9 - 10.3 mg/dL   GFR calc non Af Amer 32 (L) >60 mL/min   GFR calc Af Amer 37 (L) >60 mL/min    Comment: (NOTE) The eGFR has been calculated using the CKD EPI equation. This calculation has not been validated in all clinical situations. eGFR's persistently <60 mL/min signify possible Chronic Kidney Disease.    Anion gap 10 5 - 15     Lipid Panel  No results found for: CHOL, TRIG, HDL, CHOLHDL, VLDL, LDLCALC, LDLDIRECT   No results found for: HGBA1C   Lab Results  Component Value Date   CREATININE 1.39 (H) 09/01/2015     HPI :   82yo WF with a history of chronic atrial fibrillation not on chronic anticoagulation (patient does not know why but most likely due to frequent falls and advanced age), diverticulosis with chronic fecal incontinence, GERD, anxiety, arthritis, ? Pancreatic tumor (per medical records from [place]) and hypotension.  She apparently has a history of syncope in the past and she tells me that she has passed out several times in 7 of the past 17 years. She apparently was shopping yesterday and her  legs got very heavy and wouldn't work and she has to stop.  The ER at [facility] stated that she had syncope but she denies any dizziness and says that she did not lose consciousness.  She denies any fever, chills but has had a cough that is minimally productive.  Over the past few days and noticed increased SOB.  She has also had chest discomfort for the past 1-2 days that she describes as a wire across her chest and her back that is not worse with inspiration but is worse laying supine.  She presented to [facility] and was found to be hyponatremic with Na 127 and normal trop .  CXR showed patchy density at the lung bases felt to be atelectalsis or mild basilar PNA.  She  was hypertensive in the ER with BP 101-143/76-158mHg.  TSH was noted to be low at 5.72 and free T4 1.32.  She was found to be in atrial flutter with RVR and was given Cardizem bolus of 561mtotal and started on a gtt. Of note, she apparently was in [place] ago due to "syncope" but patient says that her spells are mainly that her legs get heavy and she cannot walk.  She did feel lightheaded but did not pass out a few days ago.    Cardiology  asked to consult for further evaluation and treatment of aflutter and CP.  HOSPITAL COURSE:     Atrial fibrillation with RVR , improving Initial cardiac enzymes, troponin 0.03,<0.03 - Was on diltiazem infusion at outside hospital, received Cardizem push and by mouth Cardizem which was discontinued due to low blood pressure She has not been on chronic anticoagulation in the past   due to frequent falls and advanced age. Her CHADS2VASC score is 4.  TSH 4.25, free T4 elevated at 1.21 Doubt pneumonia as pro-calcitonin negative - CHADS-VASc at least 4 44age x2, gender, HTN)  [facility]  CTA  no PE, cardiomegaly, moderate-sized bilateral pleural effusions, no pneumonia Discontinued CCB due to LV dysfunction and increase BB as BP tolerates Metoprolol increased to 75 mg  twice a day,.  Would avoid starting daily digoxin given her advanced age and reduced kidney function Cardiology also started patient on amiodarone 200 mg twice a day CHA2DS2 VASc score is 4, however she is not on anticoagulation given advanced age, and high fall risk Participated in physical therapy, home health recommended    Chest pain  No evidence of pneumonia, has bilateral pleural effusions according to CT scan done on 8/17 - Troponins remained negative, 2-D echo shows EF of 25-30%, diffuse hypokinesis, may need a cardiac cath, deferred to cardiology - Given ASA 324 at outside hospital , currently on aspirin 81 mg a day   Acute on chronic systolic congestive heart failure-tachycardia mediated Treated with IV Lasix, subsequently switched to Lasix 40 mg by mouth daily Renal function stable Will hold on adding ACE I so that we have enough BP for uptitrating BB for rate control and diuresing    Leg weakness, likely secondary to all of the above - Uncertain etiology, likely multifactorial  - Hyponatremia may be contributing and will be managed as below  - No back pain or numbness/tingling to suggest a cord-compression or radiculopathy - PT eval recommends home health    Hyponatremia  - Serum sodium 127 at outside hospital , now 128 , likely secondary to volume overload, hopefully should improve with diuresis - SLIV for now and follow daily BMP  - CXR, BNP 768.9, could be secondary to congestive heart failure,   sodium remained stable between 128 to 1:30    Hypothyroidism  - Recently started on Synthroid 25 mcg - TSH and free T4 at outside hospital are both minimally elevated  - Continue current-dose Synthroid for now, though would be reasonable to hold in setting of a fib RVR without clear precipitant    Cough  - CXR at outside hospital reported as bibasilar opacities, atelectasis vs PNA  - There is no fever or leukocytosis and cough is not productive   CT chest on  8/17 did not show any evidence of pneumonia  Discharge Exam:   Blood pressure 125/76, pulse 82, temperature 97.7 F (36.5 C), temperature source Oral, resp. rate 18,  height 5' 1"  (1.549 m), weight 67.2 kg (148 lb 1.6 oz), SpO2 93 %.  General appearance: alert, cooperative, no distress and elderly Neck: no carotid bruit and no JVD Lungs: clear to auscultation bilaterally Heart: irregularly irregular rhythm and regular rate Extremities: no LEE Pulses: 2+ and symmetric Skin: warm and drym and dry Neurologic: grossly normal    Follow-up Information    [place] .   Why:  HHPT, HHRN, [place] Contact information: [address] [place] [address] [phone]        [name] Chroman, MD. Schedule an appointment as soon as possible for a visit in 2 day(s).   Specialty:  Internal Medicine Contact information: [place] [address] [phone]        Fransico Him, MD. Schedule an appointment as soon as possible for a visit in 1 week(s).   Specialty:  Cardiology Why:  Please call to make this appointment as soon as possible Contact information: [address] [place]  [address] [phone]           Signed: Reyne Dumas 09/02/2015, 9:49 AM        Time spent >45 mins

## 2015-09-02 NOTE — Progress Notes (Signed)
Physical Therapy Treatment Patient Details Name: [name] [name] MRN: [id number] DOB: March 11, 1923 Today's Date: 09/02/2015    History of Present Illness 82 y.o. F presented with chest pain and bilateral leg weakness on 8/16. EKG revealed A Fib/flutter and RVR. Chest x-ray showed atelectasis.  PMHx: Afib, hypotension.    PT Comments    Pt is progressing toward goals. Pt progressed ambulation distance today. Pt's HR increased to 132 during ambulation. Initial vitals were:  BP 124/ 99, HR: 93, Sp02: 99 % on room air. Pt is still appropriate for d/c to HHPT.   Follow Up Recommendations  Home health PT;Supervision/Assistance - 24 hour     Equipment Recommendations  None recommended by PT    Recommendations for Other Services       Precautions / Restrictions Precautions Precautions: Fall Restrictions Weight Bearing Restrictions: No    Mobility  Bed Mobility               General bed mobility comments: pt was sitting in a chair upon arrivial   Transfers Overall transfer level: Needs assistance Equipment used: None Transfers: Sit to/from Stand Sit to Stand: Min guard         General transfer comment: min guard for saftey. Pt did not need physical assist.   Ambulation/Gait Ambulation/Gait assistance: Min guard Ambulation Distance (Feet): 180 Feet Assistive device: 1 person hand held assist;None (Pt requested hha at first but did not need it ) Gait Pattern/deviations: Wide base of support;Step-through pattern;Decreased stride length;Drifts right/left Gait velocity: decreased Gait velocity interpretation: Below normal speed for age/gender General Gait Details: Pt ambulated 180 feet with min guard. Pt requested hha when walking but did not physically need it and walked most of the session without it.  Pt's HR got up to 131 during ambulation.  Pt increased speed with verbal cue.   Stairs            Wheelchair Mobility    Modified Rankin (Stroke  Patients Only)       Balance Overall balance assessment: Needs assistance Sitting-balance support: No upper extremity supported;Feet supported Sitting balance-Leahy Scale: Good Sitting balance - Comments: able to sit EOB and don socks without LOB   Standing balance support: No upper extremity supported Standing balance-Leahy Scale: Fair Standing balance comment: Pt able to maintain standing balance without UE support                     Cognition Arousal/Alertness: Awake/alert Behavior During Therapy: Impulsive Overall Cognitive Status: Within Functional Limits for tasks assessed                      Exercises      General Comments General comments (skin integrity, edema, etc.): Educated pt on home saftey. Dicussed removigng throw rugs and having enough light at night to safely go to the bathroom.      Pertinent Vitals/Pain Pain Assessment: No/denies pain    Home Living                      Prior Function            PT Goals (current goals can now be found in the care plan section) Acute Rehab PT Goals Patient Stated Goal: to go home PT Goal Formulation: With patient Time For Goal Achievement: 09/11/15 Potential to Achieve Goals: Good Progress towards PT goals: Progressing toward goals    Frequency  Min 3X/week    PT  Plan Current plan remains appropriate    Co-evaluation             End of Session Equipment Utilized During Treatment: Gait belt Activity Tolerance: Patient tolerated treatment well Patient left: in chair;with call bell/phone within reach;with chair alarm set     Time: 1035-1057 PT Time Calculation (min) (ACUTE ONLY): 22 min  Charges:  $Gait Training: 8-22 mins                    G Codes:      [name] [name] 09/02/2015, 11:48 AM [name] MassonMeredith Iris [name], SPT

## 2015-09-02 NOTE — Progress Notes (Signed)
Pt discharged home. Discharge instructions have been gone over with the patient. IV's removed. Pt given unit number and told to call if they have any concerns regarding their discharge instructions. Basya Casavant V, RN   

## 2015-09-02 NOTE — Discharge Instructions (Addendum)
Angina Pectoris  Angina pectoris, often called angina, is extreme discomfort in the chest, neck, or arm. This is caused by a lack of blood in the middle and thickest layer of the heart wall (myocardium). There are four types of angina:  · Stable angina. Stable angina usually occurs in episodes of predictable frequency and duration. It is usually brought on by physical activity, stress, or excitement. Stable angina usually lasts a few minutes and can often be relieved by a medicine that you place under your tongue. This medicine is called sublingual nitroglycerin.  · Unstable angina. Unstable angina can occur even when you are doing little or no physical activity. It can even occur while you are sleeping or when you are at rest. It can suddenly increase in severity or frequency. It may not be relieved by sublingual nitroglycerin, and it can last up to 30 minutes.  · Microvascular angina. This type of angina is caused by a disorder of tiny blood vessels called arterioles. Microvascular angina is more common in women. The pain may be more severe and last longer than other types of angina pectoris.  · Prinzmetal or variant angina. This type of angina pectoris is rare and usually occurs when you are doing little or no physical activity. It especially occurs in the early morning hours.  CAUSES  Atherosclerosis is the cause of angina. This is the buildup of fat and cholesterol (plaque) on the inside of the arteries. Over time, the plaque may narrow or block the artery, and this will lessen blood flow to the heart. Plaque can also become weak and break off within a coronary artery to form a clot and cause a sudden blockage.  RISK FACTORS  Risk factors common to both men and women include:  · High cholesterol levels.  · High blood pressure (hypertension).  · Tobacco use.  · Diabetes.  · Family history of angina.  · Obesity.  · Lack of exercise.  · A diet high in saturated fats.  Women are at greater risk for angina if they  are:  · Over age 55.  · Postmenopausal.  SYMPTOMS  Many people do not experience any symptoms during the early stages of angina. As the condition progresses, symptoms common to both men and women may include:  · Chest pain.    The pain can be described as a crushing or squeezing in the chest, or a tightness, pressure, fullness, or heaviness in the chest.    The pain can last more than a few minutes, or it can stop and recur.  · Pain in the arms, neck, jaw, or back.  · Unexplained heartburn or indigestion.  · Shortness of breath.  · Nausea.  · Sudden cold sweats.  · Sudden light-headedness.  Many women have chest discomfort and some of the other symptoms. However, women often have different (atypical) symptoms, such as:   · Fatigue.  · Unexplained feelings of nervousness or anxiety.  · Unexplained weakness.  · Dizziness or fainting.  Sometimes, women may have angina without any symptoms.  DIAGNOSIS   Tests to diagnose angina may include:  · ECG (electrocardiogram).  · Exercise stress test. This looks for signs of blockage when the heart is being exercised.  · Pharmacologic stress test. This test looks for signs of blockage when the heart is being stressed with a medicine.  · Blood tests.  · Coronary angiogram. This is a procedure to look at the coronary arteries to see if there is any blockage.    surgery. This will allow your blood to pass the blockage (bypass) to reach your heart. HOME CARE INSTRUCTIONS   Take medicines only as directed by your health care provider.  Do not take the following medicines unless your health care provider approves:  Nonsteroidal anti-inflammatory drugs (NSAIDs), such as ibuprofen,  naproxen, or celecoxib.  Vitamin supplements that contain vitamin A, vitamin E, or both.  Hormone replacement therapy that contains estrogen with or without progestin.  Manage other health conditions such as hypertension and diabetes as directed by your health care provider.  Follow a heart-healthy diet. A dietitian can help to educate you about healthy food options and changes.  Use healthy cooking methods such as roasting, grilling, broiling, baking, poaching, steaming, or stir-frying. Talk to a dietitian to learn more about healthy cooking methods.  Follow an exercise program approved by your health care provider.  Maintain a healthy weight. Lose weight as approved by your health care provider.  Plan rest periods when fatigued.  Learn to manage stress.  Do not use any tobacco products, including cigarettes, chewing tobacco, or electronic cigarettes. If you need help quitting, ask your health care provider.  If you drink alcohol, and your health care provider approves, limit your alcohol intake to no more than 1 drink per day. One drink equals 12 ounces of beer, 5 ounces of wine, or 1 ounces of hard liquor.  Stop illegal drug use.  Keep all follow-up visits as directed by your health care provider. This is important. SEEK IMMEDIATE MEDICAL CARE IF:   You have pain in your chest, neck, arm, jaw, stomach, or back that lasts more than a few minutes, is recurring, or is unrelieved by taking sublingualnitroglycerin.  You have profuse sweating without cause.  You have unexplained:  Heartburn or indigestion.  Shortness of breath or difficulty breathing.  Nausea or vomiting.  Fatigue.  Feelings of nervousness or anxiety.  Weakness.  Diarrhea.  You have sudden light-headedness or dizziness.  You faint. These symptoms may represent a serious problem that is an emergency. Do not wait to see if the symptoms will go away. Get medical help right away. Call your local  emergency services (911 in the [place] Do not drive yourself to the hospital.   This information is not intended to replace advice given to you by your health care provider. Make sure you discuss any questions you have with your health care provider.   Document Released: 12/28/2004 Document Revised: 01/18/2014 Document Reviewed: 05/01/2013 Elsevier Interactive Patient Education 2016 [employer] Shortness of Breath Shortness of breath means you have trouble breathing. Shortness of breath needs medical care right away. HOME CARE   Do not smoke.  Avoid being around chemicals or things (paint fumes, dust) that may bother your breathing.  Rest as needed. Slowly begin your normal activities.  Only take medicines as told by your doctor.  Keep all doctor visits as told. GET HELP RIGHT AWAY IF:   Your shortness of breath gets worse.  You feel lightheaded, pass out (faint), or have a cough that is not helped by medicine.  You cough up blood.  You have pain with breathing.  You have pain in your chest, arms, shoulders, or belly (abdomen).  You have a fever.  You cannot walk up stairs or exercise the way you normally do.  You do not get better in the time expected.  You have a hard time doing normal activities even with rest.  You have problems with your medicines.  You have any new symptoms. MAKE SURE YOU:  Understand these instructions.  Will watch your condition.  Will get help right away if you are not doing well or get worse.   This information is not intended to replace advice given to you by your health care provider. Make sure you discuss any questions you have with your health care provider.   Document Released: 06/16/2007 Document Revised: 01/02/2013 Document Reviewed: 03/15/2011 Elsevier Interactive Patient Education [employer] Elsevier Inc.

## 2015-09-08 ENCOUNTER — Encounter: Payer: Self-pay | Admitting: Cardiology

## 2015-09-08 ENCOUNTER — Ambulatory Visit (INDEPENDENT_AMBULATORY_CARE_PROVIDER_SITE_OTHER): Payer: Medicare Other | Admitting: Cardiology

## 2015-09-08 VITALS — BP 130/86 | HR 103 | Ht 61.0 in | Wt 151.0 lb

## 2015-09-08 DIAGNOSIS — I38 Endocarditis, valve unspecified: Secondary | ICD-10-CM

## 2015-09-08 DIAGNOSIS — I482 Chronic atrial fibrillation, unspecified: Secondary | ICD-10-CM

## 2015-09-08 DIAGNOSIS — I10 Essential (primary) hypertension: Secondary | ICD-10-CM | POA: Diagnosis not present

## 2015-09-08 DIAGNOSIS — R55 Syncope and collapse: Secondary | ICD-10-CM

## 2015-09-08 DIAGNOSIS — I429 Cardiomyopathy, unspecified: Secondary | ICD-10-CM | POA: Diagnosis not present

## 2015-09-08 MED ORDER — AMIODARONE HCL 200 MG PO TABS: 200.0000 mg | ORAL_TABLET | Freq: Every day | ORAL | 3 refills | Status: DC

## 2015-09-08 NOTE — Progress Notes (Signed)
Cardiology Office Note  Date: 09/08/2015   ID: [name] [name], DOB 1923-06-19, MRN [id number]  PCP: Ignatius Speckinghruv B Vyas, MD  Consulting Cardiologist: Nona DellSamuel Taletha Twiford, MD   Chief Complaint  Patient presents with  . Establish cardiac follow-up  . Recent hospital stay    History of Present Illness: [name] [name] is a 82 y.o. female referred for cardiology consultation by Dr. Sherril CroonVyas. She has been most recently followed by Dr. Molly Madurolevenger in the [facility], last seen in December 2016. Notes indicate suspected vasovagal syncope with prior negative cardiac structural and ischemic evaluation. Further record review finds recent hospitalization at [facility] with consultation per our cardiology service for management of what was described as chronic atrial fibrillation/flutter. Efforts were made to provide better control of heart rate, she was not anticoagulated (CHADSVASC score 4) due to advanced age and high fall risk. She was also noted to have evidence of cardiomyopathy with LVEF 25-30% (potentially tachycardia-mediated), and this was managed conservatively.  She comes in today for a follow-up visit. States that she lives in her own home with her husband. She uses a rolling walker, denies any recent falls but is unsteady on her feet. She states that she has been taking her medications regularly. We discussed cutting back amiodarone to once daily dosing at this time. She is not reporting any chest pain, just generally feels "tired." Denies any significant leg edema.  Past Medical History:  Diagnosis Date  . Arthritis   . Carotid artery disease (HCC)   . Chronic atrial fibrillation (HCC)   . GERD (gastroesophageal reflux disease)   . Hypertension   . Hypothyroidism   . Vasovagal syncope     Past Surgical History:  Procedure Laterality Date  . CHOLECYSTECTOMY    . CORNEAL TRANSPLANT    . Dental implants      Current Outpatient Prescriptions  Medication Sig  Dispense Refill  . aspirin EC 81 MG tablet Take 81 mg by mouth daily.    . fesoterodine (TOVIAZ) 4 MG TB24 tablet Take 4 mg by mouth.    . furosemide (LASIX) 40 MG tablet Take 1 tablet (40 mg total) by mouth daily. 30 tablet 2  . levothyroxine (SYNTHROID, LEVOTHROID) 25 MCG tablet Take 25 mcg by mouth daily before breakfast.    . Metoprolol Tartrate 75 MG TABS Take 75 mg by mouth 2 (two) times daily. 60 tablet 3  . Misc. Devices (ROLLER WALKER) MISC Rolling walker  This rx is electronically signed 1 each 0  . amiodarone (PACERONE) 200 MG tablet Take 1 tablet (200 mg total) by mouth daily. 90 tablet 3   No current facility-administered medications for this visit.    Allergies:  Acetaminophen; Hydrocortisone; Nsaids; and Saccharin   Social History: The patient  reports that she has never smoked. She has never used smokeless tobacco.   Family History: The patient's family history includes Hypertension in her father.   ROS:  Please see the history of present illness. Otherwise, complete review of systems is positive for recent hoarseness.  All other systems are reviewed and negative.   Physical Exam: VS:  BP 130/86 (BP Location: Left Arm)   Pulse (!) 103   Ht 5\' 1"  (1.549 m)   Wt 151 lb (68.5 kg)   SpO2 97%   BMI 28.53 kg/m , BMI Body mass index is 28.53 kg/m.  Wt Readings from Last 3 Encounters:  09/08/15 151 lb (68.5 kg)  09/02/15 148 lb 1.6 oz (  67.2 kg)    General: Elderly woman, appears comfortable at rest. HEENT: Conjunctiva and lids normal, oropharynx clear. Neck: Supple, no elevated JVP or carotid bruits, no thyromegaly. Lungs: Clear to auscultation, nonlabored breathing at rest. Cardiac: Irregular, no S3, soft systolic murmur, no pericardial rub. Abdomen: Soft, nontender, bowel sounds present, no guarding or rebound. Extremities: No pitting edema, distal pulses 2+. Skin: Warm and dry. Musculoskeletal:  Kyphosis noted. Neuropsychiatric: Alert and oriented x3, affect  grossly appropriate.  ECG: I personally reviewed the tracing from 09/02/2015 which showed atrial fibrillation 100 bpm with left bundle branch block.  Recent Labwork: 08/28/2015: B Natriuretic Peptide 820.5; TSH 4.255 08/31/2015: ALT 30; AST 25; Hemoglobin 11.2; Platelets 208 09/01/2015: BUN 13; Creatinine, Ser 1.39; Magnesium 1.8; Potassium 4.1; Sodium 128   Other Studies Reviewed Today:  Echocardiogram 08/28/2015: Study Conclusions  - Left ventricle: The cavity size was normal. There was moderate   concentric hypertrophy. Systolic function was severely reduced.   The estimated ejection fraction was in the range of 25% to 30%.   Diffuse hypokinesis. - Aortic valve: There was moderate regurgitation. Regurgitation   pressure half-time: 262 ms. - Mitral valve: Severely calcified annulus, mostly posterior.   Mobility was restricted. The findings are consistent with mild   stenosis. Mean gradient (D): 3 mm Hg. - Right ventricle: The cavity size was normal. Wall thickness was   normal. Systolic function was normal. - Atrial septum: No defect or patent foramen ovale was identified. - Tricuspid valve: There was mild regurgitation. - Pulmonary arteries: Systolic pressure was within the normal   range. PA peak pressure: 37 mm Hg (S).  Chest CTA 08/28/2015: IMPRESSION: 1. No pulmonary embolism, with mild study limitations detailed above. 2. Cardiomegaly. 3. Bibasilar edema suggesting CHF. 4. Moderate-sized pleural effusions bilaterally, right slightly greater than left, with associated mild bibasilar atelectasis. 5. No evidence of pneumonia. 6. Aortic atherosclerosis.  Assessment and Plan:  1. Chronic atrial fibrillation/flutter. Heart rate control looks better than recently documented during hospital stay. I plan to cut amiodarone back to 200 mg once a daily, otherwise continue current dose of Lopressor. She will stay on aspirin, felt to be a relatively poor anticoagulation candidate  based on age and propensity to falling with history of syncope.  2. History of vasovagal syncope.  3. Essential hypertension, blood pressure control is adequate today.  4. Cardiomyopathy, possibly tachycardia-mediated. LVEF 25-30%. She is on diuretic now as well, no leg edema. Pleural effusions noted by recent chest CT. Plan to manage conservatively.  5. Moderate aortic regurgitation and mild mitral stenosis. Follow conservatively.  Current medicines were reviewed with the patient today.  Disposition: Follow-up with me in 4 months.  Signed, Jonelle Sidle, MD, [facility] 09/08/2015 10:47 AM    [facility] at [facility] [address] [place], [place], [place] [address] Phone: [phone]; Fax: [phone]

## 2015-09-08 NOTE — Patient Instructions (Signed)
Your physician recommends that you schedule a follow-up appointment in: 4 MONTHS [facility]  Your physician has recommended you make the following change in your medication:   DECREASE AMIODARONE 20 MG DAILY  Thank you for choosing [facility]!!

## 2015-09-11 ENCOUNTER — Emergency Department (HOSPITAL_COMMUNITY)
Admission: EM | Admit: 2015-09-11 | Discharge: 2015-09-11 | Disposition: A | Discharge status: Home / Self Care | Payer: Medicare Other | Attending: Emergency Medicine | Admitting: Emergency Medicine

## 2015-09-11 ENCOUNTER — Encounter (HOSPITAL_COMMUNITY): Payer: Self-pay | Admitting: Cardiology

## 2015-09-11 ENCOUNTER — Telehealth: Payer: Self-pay | Admitting: Cardiology

## 2015-09-11 DIAGNOSIS — I5021 Acute systolic (congestive) heart failure: Secondary | ICD-10-CM | POA: Insufficient documentation

## 2015-09-11 DIAGNOSIS — I110 Hypertensive heart disease with heart failure: Secondary | ICD-10-CM | POA: Diagnosis not present

## 2015-09-11 DIAGNOSIS — Z7982 Long term (current) use of aspirin: Secondary | ICD-10-CM | POA: Insufficient documentation

## 2015-09-11 DIAGNOSIS — N390 Urinary tract infection, site not specified: Secondary | ICD-10-CM | POA: Diagnosis not present

## 2015-09-11 DIAGNOSIS — R531 Weakness: Secondary | ICD-10-CM | POA: Diagnosis present

## 2015-09-11 DIAGNOSIS — Z79899 Other long term (current) drug therapy: Secondary | ICD-10-CM | POA: Diagnosis not present

## 2015-09-11 DIAGNOSIS — E039 Hypothyroidism, unspecified: Secondary | ICD-10-CM | POA: Diagnosis not present

## 2015-09-11 LAB — URINE MICROSCOPIC-ADD ON: RBC / HPF: NONE SEEN RBC/hpf (ref 0–5)

## 2015-09-11 LAB — CBC
HCT: 38.3 % (ref 36.0–46.0)
Hemoglobin: 13.4 g/dL (ref 12.0–15.0)
MCH: 32.4 pg (ref 26.0–34.0)
MCHC: 35 g/dL (ref 30.0–36.0)
MCV: 92.7 fL (ref 78.0–100.0)
Platelets: 226 10*3/uL (ref 150–400)
RBC: 4.13 MIL/uL (ref 3.87–5.11)
RDW: 13.3 % (ref 11.5–15.5)
WBC: 9.7 10*3/uL (ref 4.0–10.5)

## 2015-09-11 LAB — BASIC METABOLIC PANEL
Anion gap: 10 (ref 5–15)
BUN: 21 mg/dL — ABNORMAL HIGH (ref 6–20)
CO2: 29 mmol/L (ref 22–32)
Calcium: 8.6 mg/dL — ABNORMAL LOW (ref 8.9–10.3)
Chloride: 85 mmol/L — ABNORMAL LOW (ref 101–111)
Creatinine, Ser: 1.31 mg/dL — ABNORMAL HIGH (ref 0.44–1.00)
GFR calc Af Amer: 40 mL/min — ABNORMAL LOW (ref >60)
GFR calc non Af Amer: 35 mL/min — ABNORMAL LOW (ref >60)
Glucose, Bld: 102 mg/dL — ABNORMAL HIGH (ref 65–99)
Potassium: 3.3 mmol/L — ABNORMAL LOW (ref 3.5–5.1)
Sodium: 124 mmol/L — ABNORMAL LOW (ref 135–145)

## 2015-09-11 LAB — URINALYSIS, ROUTINE W REFLEX MICROSCOPIC
Bilirubin Urine: NEGATIVE
Glucose, UA: NEGATIVE mg/dL
Hgb urine dipstick: NEGATIVE
Ketones, ur: NEGATIVE mg/dL
Leukocytes, UA: NEGATIVE
Nitrite: POSITIVE — AB
Protein, ur: NEGATIVE mg/dL
Specific Gravity, Urine: 1.01 (ref 1.005–1.030)
pH: 6 (ref 5.0–8.0)

## 2015-09-11 MED ORDER — CEPHALEXIN 500 MG PO CAPS: 500.0000 mg | ORAL_CAPSULE | Freq: Two times a day (BID) | ORAL | 0 refills | Status: DC

## 2015-09-11 MED ORDER — POTASSIUM CHLORIDE CRYS ER 20 MEQ PO TBCR
40.0000 meq | EXTENDED_RELEASE_TABLET | Freq: Once | ORAL | Status: AC
  Administered 2015-09-11: 40 meq via ORAL
  Filled 2015-09-11: qty 2

## 2015-09-11 MED ORDER — DEXTROSE 5 % IV SOLN
1.0000 g | Freq: Once | INTRAVENOUS | Status: AC
  Administered 2015-09-11: 1 g via INTRAVENOUS
  Filled 2015-09-11: qty 10

## 2015-09-11 MED ORDER — SODIUM CHLORIDE 0.9 % IV BOLUS (SEPSIS)
500.0000 mL | Freq: Once | INTRAVENOUS | Status: AC
  Administered 2015-09-11: 500 mL via INTRAVENOUS

## 2015-09-11 NOTE — Telephone Encounter (Signed)
Patient c/o SOB, feels like she is struggling to breathe, confusion.  No chest pain.  Feels terrible.  Husband is there with her.  Advised patient to call 911 or have husband drive her to [facility] for evaluation.  She verbalized understanding.

## 2015-09-11 NOTE — ED Notes (Signed)
Patient states when she coughs she wets herself. Cleaned and changed patient at this time.

## 2015-09-11 NOTE — ED Triage Notes (Signed)
C/o weakness today.  No other complaints.

## 2015-09-11 NOTE — Telephone Encounter (Signed)
Mrs. Loleta ChanceHill called stating that she feels terrible.

## 2015-09-14 LAB — URINE CULTURE: Culture: >=100000 — AB

## 2015-09-23 NOTE — ED Provider Notes (Signed)
[facility]-EMERGENCY DEPT Provider Note   CSN: [id number] Arrival date & time: 09/11/15  1737     History   Chief Complaint Chief Complaint  Patient presents with  . Weakness    HPI [name] [name] is a 82 y.o. female.  HPI   82 year old female with generalized weakness. Symptom onset earlier today. Constant throughout the day. She was in her usual state of health yesterday. She has no specific pain complaints. No focal weakness. She reports some incontinence when coughing which is chronic to some degree, but worse with the last couple days. No dysuria or hematuria. No fevers or chills. Appetite has been fair. No nausea vomiting or diarrhea.  Past Medical History:  Diagnosis Date  . Arthritis   . Carotid artery disease (HCC)   . Chronic atrial fibrillation (HCC)   . GERD (gastroesophageal reflux disease)   . Hypertension   . Hypothyroidism   . Vasovagal syncope     Patient Active Problem List   Diagnosis Date Noted  . DCM (dilated cardiomyopathy) (HCC)   . Acute systolic CHF (congestive heart failure) (HCC)   . Atrial fibrillation with RVR (HCC) 08/28/2015  . Chest pain 08/28/2015  . Cough 08/28/2015  . Hypothyroidism 08/28/2015  . Hyponatremia 08/28/2015  . Bilateral leg weakness 08/28/2015  . Chest pain at rest 08/28/2015  . SOB (shortness of breath)   . HYPERTENSION, UNSPECIFIED 10/10/2008  . ATRIAL FLUTTER 10/10/2008  . LEFT VENTRICULAR FUNCTION, DECREASED 10/10/2008  . SYNCOPE AND COLLAPSE 10/10/2008    Past Surgical History:  Procedure Laterality Date  . CHOLECYSTECTOMY    . CORNEAL TRANSPLANT    . Dental implants      OB History    No data available       Home Medications    Prior to Admission medications   Medication Sig Start Date End Date Taking? Authorizing Provider  amiodarone (PACERONE) 200 MG tablet Take 1 tablet (200 mg total) by mouth daily. 09/08/15   Jonelle Sidle, MD  aspirin EC 81 MG tablet Take 81 mg by mouth daily.     Historical Provider, MD  cephALEXin (KEFLEX) 500 MG capsule Take 1 capsule (500 mg total) by mouth 2 (two) times daily. 09/11/15   Raeford Razor, MD  fesoterodine (TOVIAZ) 4 MG TB24 tablet Take 4 mg by mouth.    Historical Provider, MD  furosemide (LASIX) 40 MG tablet Take 1 tablet (40 mg total) by mouth daily. 09/02/15   Richarda Overlie, MD  levothyroxine (SYNTHROID, LEVOTHROID) 25 MCG tablet Take 25 mcg by mouth daily before breakfast.    Historical Provider, MD  Metoprolol Tartrate 75 MG TABS Take 75 mg by mouth 2 (two) times daily. 09/02/15   Richarda Overlie, MD  Misc. Devices (ROLLER [place]) MISC Rolling walker  This rx is electronically signed 09/02/15   Richarda Overlie, MD    Family History Family History  Problem Relation Age of Onset  . Hypertension Father     Social History Social History  Substance Use Topics  . Smoking status: Never Smoker  . Smokeless tobacco: Never Used  . Alcohol use Not on file     Allergies   Acetaminophen; Hydrocortisone; Nsaids; and Saccharin   Review of Systems Review of Systems  All systems reviewed and negative, other than as noted in HPI.   Physical Exam Updated Vital Signs BP 132/95 (BP Location: Left Arm)   Pulse 99   Temp 97.9 F (36.6 C) (Oral)   Resp 20  Ht 5\' 1"  (1.549 m)   Wt 140 lb (63.5 kg)   SpO2 95%   BMI 26.45 kg/m   Physical Exam  Constitutional: She appears well-developed and well-nourished. No distress.  HENT:  Head: Normocephalic and atraumatic.  Eyes: Conjunctivae are normal. Right eye exhibits no discharge. Left eye exhibits no discharge.  Neck: Neck supple.  Cardiovascular: Normal rate, regular rhythm and normal heart sounds.  Exam reveals no gallop and no friction rub.   No murmur heard. Pulmonary/Chest: Effort normal and breath sounds normal. No respiratory distress.  Abdominal: Soft. She exhibits no distension. There is no tenderness.  Musculoskeletal: She exhibits no edema or tenderness.  Neurological: She  is alert.  Skin: Skin is warm and dry.  Psychiatric: She has a normal mood and affect. Her behavior is normal. Thought content normal.  Nursing note and vitals reviewed.    ED Treatments / Results  Labs (all labs ordered are listed, but only abnormal results are displayed) Labs Reviewed  URINE CULTURE - Abnormal; Notable for the following:       Result Value   Culture >=100,000 COLONIES/mL ESCHERICHIA COLI (*)    Organism ID, Bacteria ESCHERICHIA COLI (*)    All other components within normal limits  BASIC METABOLIC PANEL - Abnormal; Notable for the following:    Sodium 124 (*)    Potassium 3.3 (*)    Chloride 85 (*)    Glucose, Bld 102 (*)    BUN 21 (*)    Creatinine, Ser 1.31 (*)    Calcium 8.6 (*)    GFR calc non Af Amer 35 (*)    GFR calc Af Amer 40 (*)    All other components within normal limits  URINALYSIS, ROUTINE W REFLEX MICROSCOPIC (NOT AT [facility]) - Abnormal; Notable for the following:    Nitrite POSITIVE (*)    All other components within normal limits  URINE MICROSCOPIC-ADD ON - Abnormal; Notable for the following:    Squamous Epithelial / LPF 0-5 (*)    Bacteria, UA MANY (*)    All other components within normal limits  CBC    EKG  EKG Interpretation  Date/Time:  Thursday September 11 2015 17:56:22 EDT Ventricular Rate:  84 PR Interval:    QRS Duration: 152 QT Interval:  418 QTC Calculation: 493 R Axis:   -8 Text Interpretation:  Atrial flutter with variable A-V block Left bundle branch block Abnormal ECG Confirmed by Juleen China  MD, Valyncia Wiens ([id number]) on 09/11/2015 7:01:51 PM       Radiology No results found.  Procedures Procedures (including critical care time)  Medications Ordered in ED Medications  potassium chloride SA (K-DUR,KLOR-CON) CR tablet 40 mEq (40 mEq Oral Given 09/11/15 2013)  sodium chloride 0.9 % bolus 500 mL (0 mLs Intravenous Stopped 09/11/15 2128)  cefTRIAXone (ROCEPHIN) 1 g in dextrose 5 % 50 mL IVPB (0 g Intravenous Stopped 09/11/15  2316)     Initial Impression / Assessment and Plan / ED Course  I have reviewed the triage vital signs and the nursing notes.  Pertinent labs & imaging results that were available during my care of the patient were reviewed by me and considered in my medical decision making (see chart for details).  Clinical Course    82 year old female with generalized weakness. Likely secondary to UTI. I feel she is appropriate for outpatient treatment. It has been determined that no acute conditions requiring further emergency intervention are present at this time. The patient has been advised of  the diagnosis and plan. I reviewed any labs and imaging including any potential incidental findings. We have discussed signs and symptoms that warrant return to the ED and they are listed in the discharge instructions.    Final Clinical Impressions(s) / ED Diagnoses   Final diagnoses:  UTI (lower urinary tract infection)  Generalized weakness    New Prescriptions Discharge Medication List as of 09/11/2015  9:41 PM    START taking these medications   Details  cephALEXin (KEFLEX) 500 MG capsule Take 1 capsule (500 mg total) by mouth 2 (two) times daily., Starting Thu 09/11/2015, Print         Raeford RazorStephen Lashawn Orrego, MD 09/23/15 1919

## 2015-11-20 ENCOUNTER — Other Ambulatory Visit: Payer: Self-pay | Admitting: Cardiology

## 2015-12-22 ENCOUNTER — Other Ambulatory Visit: Payer: Self-pay | Admitting: Cardiology

## 2015-12-22 ENCOUNTER — Telehealth: Payer: Self-pay | Admitting: Cardiology

## 2015-12-22 NOTE — Telephone Encounter (Signed)
Patient c/o having a reaction to furosemide. Patient states, "I have to cough in order to get my urine out, I'm very thirsty at night time only and I'm hard of hearing. I think these symptoms are related to my furosemide." Patient confirmed that she takes her furosemide around noon-3:00 pm daily. No c/o pain, burning, discharge or odor to urine. Patient c/o frequency and is only able to get a small amount of urine at a time. Patient confirmed that she started furosemide 40 mg at her first visit with [facility]. No c/o dizziness, chest pain, or sob. Patient said she is staying well hydrated drinking (3) 16 oz bottles of water per day and is eating okay. No n/v or swelling. Patient advised that she may need to contact her PCP for her symptoms and that this information would be sent to MD for further advise. Patient verbalized understanding of plan.

## 2015-12-22 NOTE — Telephone Encounter (Signed)
She was actually started on Lasix during her hospital stay at [facility] back in August, so the medication is not all that new. If her symptoms are more recent in onset, there could be something else going on. Recommend that she check with her PCP and consider get a urinalysis.

## 2015-12-22 NOTE — Telephone Encounter (Signed)
Mrs. [name] [name] called stating that she thinks she is allergic to Lasix.

## 2015-12-23 NOTE — Telephone Encounter (Signed)
Patient returned call

## 2015-12-23 NOTE — Telephone Encounter (Signed)
Patient notified

## 2016-01-08 ENCOUNTER — Encounter (HOSPITAL_COMMUNITY): Payer: Self-pay | Admitting: *Deleted

## 2016-01-08 ENCOUNTER — Emergency Department (HOSPITAL_COMMUNITY)
Admission: EM | Admit: 2016-01-08 | Discharge: 2016-01-08 | Disposition: A | Discharge status: Home / Self Care | Payer: Medicare Other | Attending: Emergency Medicine | Admitting: Emergency Medicine

## 2016-01-08 DIAGNOSIS — Z79899 Other long term (current) drug therapy: Secondary | ICD-10-CM | POA: Insufficient documentation

## 2016-01-08 DIAGNOSIS — Z7982 Long term (current) use of aspirin: Secondary | ICD-10-CM | POA: Insufficient documentation

## 2016-01-08 DIAGNOSIS — E039 Hypothyroidism, unspecified: Secondary | ICD-10-CM | POA: Diagnosis not present

## 2016-01-08 DIAGNOSIS — I5021 Acute systolic (congestive) heart failure: Secondary | ICD-10-CM | POA: Insufficient documentation

## 2016-01-08 DIAGNOSIS — H9221 Otorrhagia, right ear: Secondary | ICD-10-CM | POA: Insufficient documentation

## 2016-01-08 DIAGNOSIS — I110 Hypertensive heart disease with heart failure: Secondary | ICD-10-CM | POA: Insufficient documentation

## 2016-01-08 MED ORDER — NEOMYCIN-POLYMYXIN-HC 3.5-10000-1 OT SUSP
4.0000 [drp] | Freq: Four times a day (QID) | OTIC | Status: DC
  Filled 2016-01-08: qty 10

## 2016-01-08 NOTE — ED Provider Notes (Signed)
[facility]-EMERGENCY DEPT Provider Note   CSN: [id number] Arrival date & time: 01/08/16  [id number]     History   Chief Complaint Chief Complaint  Patient presents with  . Bleeding from Ear    HPI [name] [name] is a 82 y.o. female.  HPI   [name] [name] is a 82 y.o. female who presents to the Emergency Department complaining of persistent bleeding from her right ear.  She states that she was seen one day prior by her PMD and her ears were irrgated for was removal.  She states that her right ear was injured while irrigating with water.  Pt takes an aspirin daily and her ear has been bleeding.  She has attempted to control the bleeding with pressure, cotton balls both without relief.  She also reports decreased hearing from her ear.  She denies swelling, headaches, dizziness or vomiting.     Past Medical History:  Diagnosis Date  . Arthritis   . Carotid artery disease (HCC)   . Chronic atrial fibrillation (HCC)   . GERD (gastroesophageal reflux disease)   . Hypertension   . Hypothyroidism   . Vasovagal syncope     Patient Active Problem List   Diagnosis Date Noted  . DCM (dilated cardiomyopathy) (HCC)   . Acute systolic CHF (congestive heart failure) (HCC)   . Atrial fibrillation with RVR (HCC) 08/28/2015  . Chest pain 08/28/2015  . Cough 08/28/2015  . Hypothyroidism 08/28/2015  . Hyponatremia 08/28/2015  . Bilateral leg weakness 08/28/2015  . Chest pain at rest 08/28/2015  . SOB (shortness of breath)   . HYPERTENSION, UNSPECIFIED 10/10/2008  . ATRIAL FLUTTER 10/10/2008  . LEFT VENTRICULAR FUNCTION, DECREASED 10/10/2008  . SYNCOPE AND COLLAPSE 10/10/2008    Past Surgical History:  Procedure Laterality Date  . CHOLECYSTECTOMY    . CORNEAL TRANSPLANT    . Dental implants      OB History    No data available       Home Medications    Prior to Admission medications   Medication Sig Start Date End Date Taking? Authorizing Provider  amiodarone (PACERONE) 200 MG tablet  Take 1 tablet (200 mg total) by mouth daily. 09/08/15   Jonelle SidleSamuel G McDowell, MD  aspirin EC 81 MG tablet Take 81 mg by mouth daily.    Historical Provider, MD  furosemide (LASIX) 40 MG tablet TAKE 1 TABLET BY MOUTH DAILY 11/20/15   Jonelle SidleSamuel G McDowell, MD  levothyroxine (SYNTHROID, LEVOTHROID) 25 MCG tablet Take 25 mcg by mouth daily before breakfast.    Historical Provider, MD  metoprolol tartrate (LOPRESSOR) 25 MG tablet Take 25 mg by mouth 2 (two) times daily. 12/22/15 per patient, changed by Sherril CroonVyas one week ago    Historical Provider, MD  Misc. Devices (ROLLER KenoWALKER) MISC Rolling walker  This rx is electronically signed 09/02/15   Richarda OverlieNayana Abrol, MD    Family History Family History  Problem Relation Age of Onset  . Hypertension Father     Social History Social History  Substance Use Topics  . Smoking status: Never Smoker  . Smokeless tobacco: Never Used  . Alcohol use Not on file     Allergies   Acetaminophen; Hydrocortisone; Nsaids; and Saccharin   Review of Systems Review of Systems  Constitutional: Negative for activity change, appetite change, chills and fever.  HENT: Positive for ear pain and hearing loss. Negative for congestion, facial swelling, rhinorrhea, sore throat and trouble swallowing.   Eyes: Negative for visual disturbance.  Respiratory: Negative for  cough, shortness of breath, wheezing and stridor.   Gastrointestinal: Negative for nausea and vomiting.  Musculoskeletal: Negative for neck pain and neck stiffness.  Skin: Negative.   Neurological: Negative for dizziness, weakness, numbness and headaches.  Hematological: Negative for adenopathy. Bruises/bleeds easily.  Psychiatric/Behavioral: Negative for confusion.  All other systems reviewed and are negative.    Physical Exam Updated Vital Signs BP 145/77 (BP Location: Left Arm)   Pulse 81   Temp 97.7 F (36.5 C) (Oral)   Resp 16   Ht 5\' 1"  (1.549 m)   Wt 60.3 kg   SpO2 100%   BMI 25.13 kg/m    Physical Exam  Constitutional: She is oriented to person, place, and time. She appears well-developed and well-nourished. No distress.  HENT:  Head: Normocephalic and atraumatic.  Right Ear: There is tenderness. There is hemotympanum. Decreased hearing is noted.  Left Ear: Tympanic membrane and ear canal normal. No swelling or tenderness. No hemotympanum.  Mouth/Throat: Uvula is midline, oropharynx is clear and moist and mucous membranes are normal. No uvula swelling. No oropharyngeal exudate.  Mild to moderate blood in the right auditory canal.  Unable to properly visualize the TM, but appears to be source of the bleeding.    Eyes: EOM are normal. Pupils are equal, round, and reactive to light.  Neck: Normal range of motion. Neck supple.  Cardiovascular: Normal rate, regular rhythm and intact distal pulses.   No murmur heard. Pulmonary/Chest: Effort normal and breath sounds normal. No stridor. No respiratory distress.  Lymphadenopathy:    She has no cervical adenopathy.  Neurological: She is alert and oriented to person, place, and time. Coordination normal.  Skin: Skin is warm and dry. No rash noted.  Nursing note and vitals reviewed.    ED Treatments / Results  Labs (all labs ordered are listed, but only abnormal results are displayed) Labs Reviewed  I-STAT CHEM 8, ED    EKG  EKG Interpretation None       Radiology No results found.  Procedures Procedures (including critical care time)  Medications Ordered in ED Medications - No data to display   Initial Impression / Assessment and Plan / ED Course  I have reviewed the triage vital signs and the nursing notes.  Pertinent labs & imaging results that were available during my care of the patient were reviewed by me and considered in my medical decision making (see chart for details).  Clinical Course     1035 Consulted Dr Lucky Rathkeosen's office. Recommended cortisporin and will see pt in his office this afternoon at  2:10.    Pt also evaluated by Dr. Adriana Simasook.  Cortisporin applied, quick clot dressing applied to help control bleeding.  Pt family member agrees to f/u with ENT at the time arranged.  Pt hemodynamically stable and agrees to plan.  Final Clinical Impressions(s) / ED Diagnoses   Final diagnoses:  Bleeding from ear, right    New Prescriptions New Prescriptions   No medications on file     Rosey Bathammy Caty Tessler, PA-C 01/12/16 2037    Donnetta HutchingBrian Cook, MD 01/13/16 [phone]

## 2016-01-08 NOTE — ED Triage Notes (Signed)
Pt comes in for bleeding from right ear. She had her ear cleaned out by her doctor yesterday. The bleeding started when he did this. She has bleeding since then. NAD noted.

## 2016-01-08 NOTE — Discharge Instructions (Signed)
You have an appt with Dr. Pollyann Kennedyosen today at 2:10 pm.  Leave the packing in your ear.  Be sure to bring your insurance card with you.

## 2016-01-15 ENCOUNTER — Ambulatory Visit (INDEPENDENT_AMBULATORY_CARE_PROVIDER_SITE_OTHER): Payer: Medicare Other | Admitting: Cardiology

## 2016-01-15 ENCOUNTER — Encounter: Payer: Self-pay | Admitting: Cardiology

## 2016-01-15 VITALS — BP 102/64 | HR 100 | Ht 61.0 in | Wt 126.0 lb

## 2016-01-15 DIAGNOSIS — I429 Cardiomyopathy, unspecified: Secondary | ICD-10-CM

## 2016-01-15 DIAGNOSIS — R55 Syncope and collapse: Secondary | ICD-10-CM

## 2016-01-15 DIAGNOSIS — I38 Endocarditis, valve unspecified: Secondary | ICD-10-CM | POA: Diagnosis not present

## 2016-01-15 DIAGNOSIS — I482 Chronic atrial fibrillation, unspecified: Secondary | ICD-10-CM

## 2016-01-15 MED ORDER — FUROSEMIDE 20 MG PO TABS: 20.0000 mg | ORAL_TABLET | Freq: Every day | ORAL | 6 refills | Status: DC

## 2016-01-15 NOTE — Patient Instructions (Signed)
Medication Instructions:   Decrease Lasix to 20mg  daily.   Continue all other medications.    Labwork: LFT, TSH - just prior to next office visit.   Testing/Procedures: none  Follow-Up: Your physician wants you to follow up in: 6 months.  You will receive a reminder letter in the mail one-two months in advance.  If you don't receive a letter, please call our office to schedule the follow up appointment   Any Other Special Instructions Will Be Listed Below (If Applicable).  If you need a refill on your cardiac medications before your next appointment, please call your pharmacy.

## 2016-01-15 NOTE — Progress Notes (Signed)
Cardiology Office Note  Date: 01/15/2016   ID: [name] [name], DOB March 01, 1923, MRN [id number]  PCP: [name], MD  Primary Cardiologist: Rozann Lesches, MD   Chief Complaint  Patient presents with  . Atrial Fibrillation    History of Present Illness: [name] [name] is a 83 y.o. female that I met for the first time in August. She presents for a routine follow-up visit. States that she does not feel any palpitations or chest pain. Remains relatively functional in her basic ADLs although still unsteady on her feet. She continues to follow with Dr. Woody Seller for primary care.  CHADSVASC score is 4. She has not been anticoagulated however with propensity to fall and history of syncope.  I reviewed her medications today. She continues on Lopressor, amiodarone, and Lasix. Weight is down 14 pounds since her last visit.  Past Medical History:  Diagnosis Date  . Arthritis   . Carotid artery disease ([place])   . Chronic atrial fibrillation ([place])   . GERD (gastroesophageal reflux disease)   . Hypertension   . Hypothyroidism   . Vasovagal syncope     Past Surgical History:  Procedure Laterality Date  . CHOLECYSTECTOMY    . CORNEAL TRANSPLANT    . Dental implants      Current Outpatient Prescriptions  Medication Sig Dispense Refill  . amiodarone (PACERONE) 200 MG tablet Take 1 tablet (200 mg total) by mouth daily. 90 tablet 3  . aspirin EC 81 MG tablet Take 81 mg by mouth daily.    . furosemide (LASIX) 20 MG tablet Take 1 tablet (20 mg total) by mouth daily. 30 tablet 6  . levothyroxine (SYNTHROID, LEVOTHROID) 25 MCG tablet Take 25 mcg by mouth daily before breakfast.    . metoprolol tartrate (LOPRESSOR) 25 MG tablet Take 25 mg by mouth daily. 12/22/15 per patient, changed by Woody Seller one week ago     . Misc. Devices (ROLLER WALKER) MISC Rolling walker  This rx is electronically signed 1 each 0   No current facility-administered medications for this visit.    Allergies:  Acetaminophen;  Hydrocortisone; Nsaids; and Saccharin   Social History: The patient  reports that she has never smoked. She has never used smokeless tobacco.   ROS:  Please see the history of present illness. Otherwise, complete review of systems is positive for hearing loss.  All other systems are reviewed and negative.   Physical Exam: VS:  BP 102/64   Pulse 100   Ht 5' 1"  (1.549 m)   Wt 126 lb (57.2 kg)   SpO2 99%   BMI 23.81 kg/m , BMI Body mass index is 23.81 kg/m.  Wt Readings from Last 3 Encounters:  01/15/16 126 lb (57.2 kg)  01/08/16 133 lb (60.3 kg)  09/11/15 140 lb (63.5 kg)    General: Elderly woman, appears comfortable at rest. HEENT: Conjunctiva and lids normal, oropharynx clear. Neck: Supple, no elevated JVP or carotid bruits, no thyromegaly. Lungs: Clear to auscultation, nonlabored breathing at rest. Cardiac: Irregular, no S3, soft systolic murmur, no pericardial rub. Abdomen: Soft, nontender, bowel sounds present. Extremities: No pitting edema, distal pulses 2+. Skin: Warm and dry. Musculoskeletal:  Kyphosis noted. Neuropsychiatric: Alert and oriented x3, affect grossly appropriate.  ECG: I personally reviewed the tracing from 09/11/2015 which showed atrial flutter with variable conduction and left bundle branch block.  Recent Labwork: 08/28/2015: B Natriuretic Peptide 820.5; TSH 4.255 08/31/2015: ALT 30; AST 25 09/01/2015: Magnesium 1.8 09/11/2015: BUN 21; Creatinine, Ser 1.31; Hemoglobin  13.4; Platelets 226; Potassium 3.3; Sodium 124   Other Studies Reviewed Today:  Echocardiogram 08/28/2015: Study Conclusions  - Left ventricle: The cavity size was normal. There was moderate   concentric hypertrophy. Systolic function was severely reduced.   The estimated ejection fraction was in the range of 25% to 30%.   Diffuse hypokinesis. - Aortic valve: There was moderate regurgitation. Regurgitation   pressure half-time: 262 ms. - Mitral valve: Severely calcified annulus,  mostly posterior.   Mobility was restricted. The findings are consistent with mild   stenosis. Mean gradient (D): 3 mm Hg. - Right ventricle: The cavity size was normal. Wall thickness was   normal. Systolic function was normal. - Atrial septum: No defect or patent foramen ovale was identified. - Tricuspid valve: There was mild regurgitation. - Pulmonary arteries: Systolic pressure was within the normal   range. PA peak pressure: 37 mm Hg (S).  Assessment and Plan:  1. Chronic atrial fibrillation/flutter. Plan to continue combination of Lopressor and amiodarone for heart rate control, she will need follow-up TSH and LFTs for her next visit. As already discussed, overall poor candidate for long-term anticoagulation, will continue aspirin. CHADSVASC score is 4.  2. Secondary cardiomyopathy, possibly tachycardia-mediated. Plan to manage conservatively. Reduce Lasix to 20 mg daily with associated weight loss. Not on ACE inhibitor or ARB with low blood pressure at baseline and history of vasovagal syncope.  3. Valvular heart disease including moderate aortic regurgitation and mild mitral stenosis. To be managed conservatively.  4. History of vasovagal syncope, no recent episodes by report.  Current medicines were reviewed with the patient today.  Disposition: Follow-up in 6 months.  Signed, Satira Sark, MD, [facility] 01/15/2016 10:58 AM    [facility] [place], [place], [place] [address] Phone: [phone]; Fax: [phone]

## 2016-02-17 ENCOUNTER — Other Ambulatory Visit (HOSPITAL_COMMUNITY)
Admission: RE | Admit: 2016-02-17 | Discharge: 2016-02-17 | Disposition: A | Discharge status: Home / Self Care | Payer: Medicare Other | Source: Skilled Nursing Facility | Attending: Urology | Admitting: Urology

## 2016-02-17 ENCOUNTER — Ambulatory Visit (INDEPENDENT_AMBULATORY_CARE_PROVIDER_SITE_OTHER): Payer: Medicare Other | Admitting: Urology

## 2016-02-17 DIAGNOSIS — N3281 Overactive bladder: Secondary | ICD-10-CM

## 2016-02-17 DIAGNOSIS — R311 Benign essential microscopic hematuria: Secondary | ICD-10-CM | POA: Insufficient documentation

## 2016-02-17 LAB — URINALYSIS, ROUTINE W REFLEX MICROSCOPIC
BILIRUBIN URINE: NEGATIVE
Glucose, UA: NEGATIVE mg/dL
KETONES UR: NEGATIVE mg/dL
NITRITE: NEGATIVE
Protein, ur: NEGATIVE mg/dL
Specific Gravity, Urine: 1.01 (ref 1.005–1.030)
pH: 6 (ref 5.0–8.0)

## 2016-02-17 LAB — URINALYSIS, MICROSCOPIC (REFLEX)

## 2016-04-01 NOTE — Progress Notes (Signed)
Cardiology Office Note  Date: 04/02/2016   ID: [name] [name], DOB 21-Dec-1923, MRN [id number]  PCP: Ignatius Specking, MD  Primary Cardiologist: Nona Dell, MD   Chief Complaint  Patient presents with  . Hospitalization Follow-up    History of Present Illness: [name] [name] is a 83 y.o. female last seen in January. She is referred back to the office by Dr. Sherril Croon after recent admission at [facility] with acute on chronic systolic heart failure and urinary tract infection. She is currently in the [facility] undergoing rehabilitation, is here today with 2 assistants. She states that she feels much better overall, is gaining strength, would like to go home soon.  I reviewed her medications which are outlined below and overall stable from a cardiac perspective. CHADSVASC score is 4. She has not been anticoagulated however with propensity to fall and history of syncope. Her weight is down about 6 pounds compared to January. Still has some mild ankle edema that is dependent.  Past Medical History:  Diagnosis Date  . Arthritis   . Carotid artery disease (HCC)   . Chronic atrial fibrillation (HCC)   . GERD (gastroesophageal reflux disease)   . Hypertension   . Hypothyroidism   . Vasovagal syncope     Past Surgical History:  Procedure Laterality Date  . CHOLECYSTECTOMY    . CORNEAL TRANSPLANT    . Dental implants      Current Outpatient Prescriptions  Medication Sig Dispense Refill  . amiodarone (PACERONE) 200 MG tablet Take 1 tablet (200 mg total) by mouth daily. 90 tablet 3  . aspirin EC 81 MG tablet Take 81 mg by mouth daily.    Marland Kitchen azelastine (OPTIVAR) 0.05 % ophthalmic solution   5  . Cholecalciferol (VITAMIN D-3) 1000 units CAPS Take by mouth.    . fesoterodine (TOVIAZ) 4 MG TB24 tablet Take 4 mg by mouth daily.    . furosemide (LASIX) 40 MG tablet Take 40 mg by mouth.    . levothyroxine (SYNTHROID, LEVOTHROID) 25 MCG tablet Take 25 mcg by mouth daily  before breakfast.    . metoprolol tartrate (LOPRESSOR) 25 MG tablet Take 25 mg by mouth daily. 12/22/15 per patient, changed by Sherril Croon one week ago     . Misc. Devices (ROLLER WALKER) MISC Rolling walker  This rx is electronically signed 1 each 0   No current facility-administered medications for this visit.    Allergies:  Acetaminophen; Hydrocortisone; Nsaids; and Saccharin   Social History: The patient  reports that she has never smoked. She has never used smokeless tobacco.   ROS:  Please see the history of present illness. Otherwise, complete review of systems is positive for hearing loss.  All other systems are reviewed and negative.   Physical Exam: VS:  BP (!) 94/50   Pulse 99   Wt 120 lb (54.4 kg)   SpO2 94%   BMI 22.67 kg/m , BMI Body mass index is 22.67 kg/m.  Wt Readings from Last 3 Encounters:  04/02/16 120 lb (54.4 kg)  01/15/16 126 lb (57.2 kg)  01/08/16 133 lb (60.3 kg)    General: Elderly woman,appears comfortable at rest. In wheelchair. HEENT: Conjunctiva and lids normal, oropharynx clear. Neck: Supple, no elevated JVP or carotid bruits, no thyromegaly. Lungs: Clear to auscultation, nonlabored breathing at rest. Cardiac: Irregular, no S3, softsystolic murmur, no pericardial rub. Abdomen: Soft, nontender, bowel sounds present. Extremities: No pitting edema, distal pulses 2+. Skin: Warm and dry. Musculoskeletal:  Kyphosis noted. Neuropsychiatric: Alert and oriented x3, affect grossly appropriate. Decreased hearing.  ECG: I personally reviewed the tracing from 09/11/2015 which showed atrial flutter with variable conduction and left bundle branch block. Follow-up tracing from 03/19/2016 showed rate-controlled atrial fibrillation/flutter with left bundle branch block.  Recent Labwork: 08/28/2015: B Natriuretic Peptide 820.5; TSH 4.255 08/31/2015: ALT 30; AST 25 09/01/2015: Magnesium 1.8 09/11/2015: BUN 21; Creatinine, Ser 1.31; Hemoglobin 13.4; Platelets 226;  Potassium 3.3; Sodium 124  March 2018: BUN 11, creatinine 1.0, potassium 3.8, hemoglobin 13.9, platelets 175, urine culture positive for Klebsiella Oxytoca  Other Studies Reviewed Today:  Echocardiogram 08/28/2015: Study Conclusions  - Left ventricle: The cavity size was normal. There was moderate concentric hypertrophy. Systolic function was severely reduced. The estimated ejection fraction was in the range of 25% to 30%. Diffuse hypokinesis. - Aortic valve: There was moderate regurgitation. Regurgitation pressure half-time: 262 ms. - Mitral valve: Severely calcified annulus, mostly posterior. Mobility was restricted. The findings are consistent with mild stenosis. Mean gradient (D): 3 mm Hg. - Right ventricle: The cavity size was normal. Wall thickness was normal. Systolic function was normal. - Atrial septum: No defect or patent foramen ovale was identified. - Tricuspid valve: There was mild regurgitation. - Pulmonary arteries: Systolic pressure was within the normal range. PA peak pressure: 37 mm Hg (S).  Echocardiogram 03/17/2016 [facility]): LVEF 25-30%, moderate left atrial enlargement, moderate right atrial enlargement, moderately sclerotic aortic valve with mild aortic regurgitation, mitral annular calcification, severe tricuspid regurgitation, mild pulmonic regurgitation, moderate size left pleural effusion, RVSP 43 mmHg.  Chest x-ray 03/20/2016 [facility]): Bilateral pleural effusions with basilar right lung infiltrate versus atelectasis.  Assessment and Plan:  1. Chronic systolic heart failure with LVEF 25-30% range. She is status post hospitalization with volume overload, feeling better now and remains on Lasix. Her weight is down 6 pounds from January.  2. Chronic atrial fibrillation. She is on aspirin as discussed above in addition to amiodarone and Lopressor. Asymptomatic in terms of palpitations.  3. Essential  hypertension by history, and low-normal blood pressure today. She is asymptomatic. This limits addition of ARB or ACE inhibitor.  4. History of vasovagal syncope, no recent episodes. No reported orthostatic dizziness. Continue observation.  Current medicines were reviewed with the patient today.  Disposition: Follow-up in 6 weeks.  Signed, Jonelle SidleSamuel G. Jeanine Caven, MD, [facility] 04/02/2016 2:08 PM    [facility] [address], [place], [place] [address] Phone: [phone]; Fax: [phone]

## 2016-04-02 ENCOUNTER — Ambulatory Visit (INDEPENDENT_AMBULATORY_CARE_PROVIDER_SITE_OTHER): Payer: Medicare Other | Admitting: Cardiology

## 2016-04-02 ENCOUNTER — Encounter: Payer: Self-pay | Admitting: Cardiology

## 2016-04-02 VITALS — BP 94/50 | HR 99 | Wt 120.0 lb

## 2016-04-02 DIAGNOSIS — I5022 Chronic systolic (congestive) heart failure: Secondary | ICD-10-CM

## 2016-04-02 DIAGNOSIS — I10 Essential (primary) hypertension: Secondary | ICD-10-CM | POA: Diagnosis not present

## 2016-04-02 DIAGNOSIS — I482 Chronic atrial fibrillation, unspecified: Secondary | ICD-10-CM

## 2016-04-02 DIAGNOSIS — I429 Cardiomyopathy, unspecified: Secondary | ICD-10-CM

## 2016-04-02 DIAGNOSIS — R55 Syncope and collapse: Secondary | ICD-10-CM

## 2016-04-02 NOTE — Patient Instructions (Signed)
Your physician recommends that you schedule a follow-up appointment in: 6 Weeks with Dr. Diona BrownerMcDowell in [place].  Your physician recommends that you continue on your current medications as directed. Please refer to the Current Medication list given to you today.  If you need a refill on your cardiac medications before your next appointment, please call your pharmacy.  Thank you for choosing [facility]!

## 2016-04-23 ENCOUNTER — Encounter: Payer: Medicare Other | Admitting: Cardiology

## 2016-05-13 ENCOUNTER — Encounter: Payer: Self-pay | Admitting: Cardiology

## 2016-05-13 NOTE — Progress Notes (Deleted)
Cardiology Office Note  Date: 05/13/2016   ID: [name] [name], DOB 01-02-1924, MRN [id number]  PCP: Ignatius Specking, MD  Primary Cardiologist: Nona Dell, MD   No chief complaint on file.   History of Present Illness: [name] [name] is a 83 y.o. female last seen in March.  CHADSVASC score is 4. She has not been anticoagulated however with propensity to fall and history of syncope.  Past Medical History:  Diagnosis Date  . Arthritis   . Cardiomyopathy (HCC)   . Carotid artery disease (HCC)   . Chronic atrial fibrillation (HCC)   . GERD (gastroesophageal reflux disease)   . Hypertension   . Hypothyroidism   . Vasovagal syncope     Past Surgical History:  Procedure Laterality Date  . CHOLECYSTECTOMY    . CORNEAL TRANSPLANT    . Dental implants      Current Outpatient Prescriptions  Medication Sig Dispense Refill  . amiodarone (PACERONE) 200 MG tablet Take 1 tablet (200 mg total) by mouth daily. 90 tablet 3  . aspirin EC 81 MG tablet Take 81 mg by mouth daily.    Marland Kitchen azelastine (OPTIVAR) 0.05 % ophthalmic solution   5  . Cholecalciferol (VITAMIN D-3) 1000 units CAPS Take by mouth.    . fesoterodine (TOVIAZ) 4 MG TB24 tablet Take 4 mg by mouth daily.    . furosemide (LASIX) 40 MG tablet Take 40 mg by mouth.    . levothyroxine (SYNTHROID, LEVOTHROID) 25 MCG tablet Take 25 mcg by mouth daily before breakfast.    . metoprolol tartrate (LOPRESSOR) 25 MG tablet Take 25 mg by mouth daily. 12/22/15 per patient, changed by Sherril Croon one week ago     . Misc. Devices (ROLLER WALKER) MISC Rolling walker  This rx is electronically signed 1 each 0   No current facility-administered medications for this visit.    Allergies:  Acetaminophen; Hydrocortisone; Nsaids; and Saccharin   Social History: The patient  reports that she has never smoked. She has never used smokeless tobacco.   Family History: The patient's family history includes Hypertension in her father.   ROS:  Please see the  history of present illness. Otherwise, complete review of systems is positive for {NONE DEFAULTED:18576::"none"}.  All other systems are reviewed and negative.   Physical Exam: VS:  There were no vitals taken for this visit., BMI There is no height or weight on file to calculate BMI.  Wt Readings from Last 3 Encounters:  04/02/16 120 lb (54.4 kg)  01/15/16 126 lb (57.2 kg)  01/08/16 133 lb (60.3 kg)    General: Elderly woman,appears comfortable at rest. In wheelchair. HEENT: Conjunctiva and lids normal, oropharynx clear. Neck: Supple, no elevated JVP or carotid bruits, no thyromegaly. Lungs: Clear to auscultation, nonlabored breathing at rest. Cardiac: Irregular, no S3, softsystolic murmur, no pericardial rub. Abdomen: Soft, nontender, bowel sounds present. Extremities: No pitting edema, distal pulses 2+. Skin: Warm and dry. Musculoskeletal: Kyphosis noted. Neuropsychiatric: Alert and oriented x3, affect grossly appropriate. Decreased hearing.  ECG: I personally reviewed the tracing from 03/19/2016 which showed probable course atrial fibrillation/flutter with "branch block.  Recent Labwork: 08/28/2015: B Natriuretic Peptide 820.5; TSH 4.255 08/31/2015: ALT 30; AST 25 09/01/2015: Magnesium 1.8 09/11/2015: BUN 21; Creatinine, Ser 1.31; Hemoglobin 13.4; Platelets 226; Potassium 3.3; Sodium 124   Other Studies Reviewed Today:  Echocardiogram 03/17/2016 [facility]): LVEF 25-30%, moderate left atrial enlargement, moderate right atrial enlargement, moderately sclerotic aortic valve with mild aortic regurgitation, mitral annular  calcification, severe tricuspid regurgitation, mild pulmonic regurgitation, moderate size left pleural effusion, RVSP 43 mmHg.  Assessment and Plan:    Current medicines were reviewed with the patient today.  No orders of the defined types were placed in this encounter.   Disposition:  Signed, Jonelle SidleSamuel G. McDowell, MD, [facility] 05/13/2016 9:32 AM      [facility] at [facility] [address], [place], [place] [address] Phone: [phone](336) [phone]; Fax: [phone]

## 2016-05-14 ENCOUNTER — Encounter: Payer: Self-pay | Admitting: Cardiology

## 2016-05-14 ENCOUNTER — Ambulatory Visit: Payer: Medicare Other | Admitting: Cardiology

## 2016-08-02 ENCOUNTER — Encounter: Payer: Self-pay | Admitting: Cardiology

## 2016-08-02 ENCOUNTER — Ambulatory Visit (INDEPENDENT_AMBULATORY_CARE_PROVIDER_SITE_OTHER): Payer: Medicare Other | Admitting: Cardiology

## 2016-08-02 VITALS — BP 130/80 | HR 82 | Ht 61.0 in | Wt 113.0 lb

## 2016-08-02 DIAGNOSIS — I482 Chronic atrial fibrillation, unspecified: Secondary | ICD-10-CM

## 2016-08-02 DIAGNOSIS — I5022 Chronic systolic (congestive) heart failure: Secondary | ICD-10-CM

## 2016-08-02 DIAGNOSIS — I10 Essential (primary) hypertension: Secondary | ICD-10-CM | POA: Diagnosis not present

## 2016-08-02 NOTE — Patient Instructions (Signed)
Medication Instructions:  Your physician recommends that you continue on your current medications as directed. Please refer to the Current Medication list given to you today.  Labwork: NONE  Testing/Procedures: NONE  Follow-Up: Your physician wants you to follow-up in: 4 MONTHS WITH DR. MCDOWELL. You will receive a reminder letter in the mail two months in advance. If you don't receive a letter, please call our office to schedule the follow-up appointment.  Any Other Special Instructions Will Be Listed Below (If Applicable).  If you need a refill on your cardiac medications before your next appointment, please call your pharmacy. 

## 2016-08-02 NOTE — Progress Notes (Signed)
Cardiology Office Note  Date: 08/02/2016   ID: [name] [name], DOB [birth year], MRN [id number]  PCP: Ignatius Specking, MD  Primary Cardiologist: Nona Dell, MD   Chief Complaint  Patient presents with  . Cardiomyopathy    History of Present Illness: [name] [name] is a 83 y.o. female last seen in March. She is here with her daughter for a follow-up visit. Overall, she reports doing better in general, no chest pain or palpitations, uses intermittent supplemental oxygen at home per Dr. Sherril Croon.  We went over her medications in detail. Current cardiac regimen includes aspirin, Lasix at 40 mg once daily, and Lopressor 25 mg twice daily. She has not actually been on amiodarone for quite some time.  Her weight is down 7 pounds from March. She states that she has a functioning scale at home.  Past Medical History:  Diagnosis Date  . Arthritis   . Cardiomyopathy (HCC)   . Carotid artery disease (HCC)   . Chronic atrial fibrillation (HCC)   . GERD (gastroesophageal reflux disease)   . Hypertension   . Hypothyroidism   . Vasovagal syncope     Past Surgical History:  Procedure Laterality Date  . CHOLECYSTECTOMY    . CORNEAL TRANSPLANT    . Dental implants      Current Outpatient Prescriptions  Medication Sig Dispense Refill  . aspirin EC 81 MG tablet Take 81 mg by mouth daily.    . cholecalciferol (VITAMIN D) 1000 units tablet Take 1 tablet by mouth daily.  12  . fesoterodine (TOVIAZ) 4 MG TB24 tablet Take 4 mg by mouth daily.    . furosemide (LASIX) 40 MG tablet Take 40 mg by mouth.    . levothyroxine (SYNTHROID, LEVOTHROID) 25 MCG tablet Take 25 mcg by mouth daily before breakfast.    . metoprolol tartrate (LOPRESSOR) 25 MG tablet Take 25 mg by mouth daily. 12/22/15 per patient, changed by Sherril Croon one week ago      No current facility-administered medications for this visit.    Allergies:  Acetaminophen; Hydrocortisone; Nsaids; Saccharin; and Mold extract [trichophyton]   Social  History: The patient  reports that she has never smoked. She has never used smokeless tobacco.   Family History: The patient's family history includes Hypertension in her father.   ROS:  Please see the history of present illness. Otherwise, complete review of systems is positive for hearing loss.  All other systems are reviewed and negative.   Physical Exam: VS:  BP 130/80   Pulse 82   Ht 5\' 1"  (1.549 m)   Wt 113 lb (51.3 kg)   BMI 21.35 kg/m , BMI Body mass index is 21.35 kg/m.  Wt Readings from Last 3 Encounters:  08/02/16 113 lb (51.3 kg)  04/02/16 120 lb (54.4 kg)  01/15/16 126 lb (57.2 kg)    General: Elderly woman,appears comfortable at rest. In wheelchair. HEENT: Conjunctiva and lids normal, oropharynx clear. Neck: Supple, no elevated JVP or carotid bruits, no thyromegaly. Lungs: Clear to auscultation, nonlabored breathing at rest. Cardiac: Irregular, no S3, softsystolic murmur, no pericardial rub. Abdomen: Soft, nontender, bowel sounds present. Extremities: No pitting edema, distal pulses 2+. Skin: Warm and dry. Musculoskeletal: Kyphosis noted. Neuropsychiatric: Alert and oriented x3, affect grossly appropriate. Decreased hearing.  ECG: I personally reviewed the tracing from 03/19/2016 which showed rate-controlled atrial fibrillation/flutter with left bundle branch block.  Recent Labwork: 08/28/2015: B Natriuretic Peptide 820.5; TSH 4.255 08/31/2015: ALT 30; AST 25 09/01/2015: Magnesium 1.8 09/11/2015: BUN 21;  Creatinine, Ser 1.31; Hemoglobin 13.4; Platelets 226; Potassium 3.3; Sodium 124  March 2018: BUN 11, creatinine 1.0, potassium 3.8, hemoglobin 13.9, platelets 175  Other Studies Reviewed Today:  Echocardiogram 03/17/2016 [facility]): LVEF 25-30%, moderate left atrial enlargement, moderate right atrial enlargement, moderately sclerotic aortic valve with mild aortic regurgitation, mitral annular calcification, severe tricuspid regurgitation, mild  pulmonic regurgitation, moderate size left pleural effusion, RVSP 43 mmHg.  Assessment and Plan:  1. Chronic atrial fibrillation/flutter. She reports no palpitations. Current regimen includes aspirin and Lopressor. She is not anticoagulated with propensity to falling and history of syncope.  2. Chronic systolic heart failure with LVEF 25-30%. Her weight is down and volume status looks better controlled. Continue Lasix at 40 mg daily for now.  3. History of hypertension. Systolic in the 130s today. Would keep follow-up with Dr. Sherril CroonVyas. Might consider trying to add low-dose ARB with cardiomyopathy.  Current medicines were reviewed with the patient today.  Disposition: Follow-up in 4 months.  Signed, Jonelle SidleSamuel G. Keiera Strathman, MD, [facility] 08/02/2016 3:03 PM    [facility] [address], [place], [place] [address] Phone: [phone](336) [phone]; Fax: [phone]

## 2016-08-17 ENCOUNTER — Ambulatory Visit: Payer: Medicare Other | Admitting: Urology

## 2016-11-30 NOTE — Progress Notes (Signed)
Cardiology Office Note  Date: 12/01/2016   ID: [name] [name], DOB 02-02-1923, MRN [id number]  PCP: Ignatius SpeckingVyas, Dhruv B, MD  Primary Cardiologist: Nona DellSamuel Rhylen Shaheen, MD   Chief Complaint  Patient presents with  . Cardiomyopathy    History of Present Illness: [name] NobleMarion Supak is a 83 y.o. female last seen in July.  She presents today with her daughter for a follow-up visit.  [facility] October with worsening shortness of breath and evidence of acute on chronic systolic heart failure.  Records indicate that she was diuresed with IV Lasix and also discharged to nursing facility for further rehabilitation.  Her weight is up 12 pounds compared to our last visit.  In reviewing her current medications I see that she is on Lasix at 20 mg twice daily which is half the dose that we had her on previously.  She is also taking Lopressor 25 mg once daily and was put back on amiodarone 200 mg daily although she persists in atrial fibrillation/flutter.  Review her recent lab work which is outlined below.  She continues to report worsening leg edema.  I personally reviewed her ECG from 10/25/2016 which shows probable atrial flutter with variable conduction, IVCD of left bundle branch block type.  Past Medical History:  Diagnosis Date  . Arthritis   . Cardiomyopathy (HCC)   . Carotid artery disease (HCC)   . Chronic atrial fibrillation (HCC)   . GERD (gastroesophageal reflux disease)   . Hypertension   . Hypothyroidism   . Vasovagal syncope     Past Surgical History:  Procedure Laterality Date  . CHOLECYSTECTOMY    . CORNEAL TRANSPLANT    . Dental implants      Current Outpatient Medications  Medication Sig Dispense Refill  . aspirin EC 81 MG tablet Take 81 mg by mouth daily.    . calcium carbonate (TUMS - DOSED IN MG ELEMENTAL CALCIUM) 500 MG chewable tablet Chew 1 tablet by mouth 2 (two) times daily.    . cholecalciferol (VITAMIN D) 1000 units tablet Take 1  tablet by mouth daily.  12  . furosemide (LASIX) 40 MG tablet Take 40 mg by mouth 2 (two) times daily.     Marland Kitchen. levothyroxine (SYNTHROID, LEVOTHROID) 25 MCG tablet Take 25 mcg by mouth daily before breakfast.    . Multiple Vitamin (MULTIVITAMIN) tablet Take 1 tablet by mouth daily.    . metoprolol tartrate (LOPRESSOR) 25 MG tablet Take 1 tablet (25 mg total) by mouth 2 (two) times daily. 180 tablet 3   No current facility-administered medications for this visit.    Allergies:  Acetaminophen; Hydrocortisone; Nsaids; Saccharin; and Mold extract [trichophyton]   Social History: The patient  reports that  has never smoked. she has never used smokeless tobacco.  ROS:  Please see the history of present illness. Otherwise, complete review of systems is positive for occasional nosebleed, apparent aspiration of liquids, generalized fatigue.  All other systems are reviewed and negative.   Physical Exam: VS:  BP 102/70   Pulse 66   Ht 5\' 1"  (1.549 m)   Wt 125 lb 9.6 oz (57 kg)   SpO2 91% Comment: 3L/min 24/7  BMI 23.73 kg/m , BMI Body mass index is 23.73 kg/m.  Wt Readings from Last 3 Encounters:  12/01/16 125 lb 9.6 oz (57 kg)  08/02/16 113 lb (51.3 kg)  04/02/16 120 lb (54.4 kg)    General: Chronically ill-appearing elderly woman in no distress.  HEENT: Conjunctiva and lids normal, oropharynx clear. Neck: Supple, no carotid bruits, no thyromegaly. Lungs: Decreased breath sounds at the bases, left worse than right, no wheezing. Cardiac: Irregularly irregular, no S3. Abdomen: Soft, nontender, bowel sounds present. Extremities: 2+ bilateral leg edema, distal pulses 2+. Skin: Warm and dry. Musculoskeletal: Kyphosis present. Neuropsychiatric: Alert and oriented x3, affect grossly appropriate.  ECG: I personally reviewed the tracing from 03/19/2016 which showed atrial fibrillation/flutter with left bundle branch block.  Recent Labwork:  March 2018: BUN 11, creatinine 1.0, potassium 3.8,  hemoglobin 13.9, platelets 175 October 2018: BUN 17, creatinine 1.02, sodium 135, potassium 3.8, peak NT-proBNP 12172, troponin T negative x3, hemoglobin 13.4, platelets 206  Other Studies Reviewed Today:  Echocardiogram 03/17/2016 [facility]): LVEF 25-30%, moderate left atrial enlargement, moderate right atrial enlargement, moderately sclerotic aortic valve with mild aortic regurgitation, mitral annular calcification, severe tricuspid regurgitation, mild pulmonic regurgitation, moderate size left pleural effusion, RVSP 43 mmHg.  Chest x-ray 10/25/2016: Moderate right pleural effusion, small left pleural effusion, atelectasis.  Assessment and Plan:  1.  Acute on chronic systolic heart failure.  She has evidence of fluid overload with weight gain since last encounter was [facility].  I reviewed her records and lab work.  Plan to increase Lasix back to 40 mg twice daily, she might even need a higher dose depending on diuretic effect.  Increase Lopressor to 25 mg twice daily instead of once daily.  Follow-up BMET in the next few weeks.  Keep close follow-up with PCP.  2.  Persistent atrial fibrillation/flutter.  Do not see a good reason to continue her on amiodarone since this is not providing any rhythm control.  Use Lopressor for heart rate control.  She is on aspirin rather than anticoagulation with propensity to falling and history of syncope.  3.  History of hypertension, blood pressure low normal today.  Would hold off on adding ARB at this time.  Current medicines were reviewed with the patient today.   Orders Placed This Encounter  Procedures  . Basic metabolic panel    Disposition: Follow-up in 3 months.  Signed, Jonelle SidleSamuel G. Avah Bashor, MD, [facility] CenterFACC 12/01/2016 2:01 PM    [facility] [address], [place], [place] [address] Phone: [phone](336) [phone]; Fax: [phone]) [phone]

## 2016-12-01 ENCOUNTER — Ambulatory Visit: Payer: Medicare Other | Admitting: Cardiology

## 2016-12-01 ENCOUNTER — Encounter: Payer: Self-pay | Admitting: Cardiology

## 2016-12-01 VITALS — BP 102/70 | HR 66 | Ht 61.0 in | Wt 125.6 lb

## 2016-12-01 DIAGNOSIS — I5023 Acute on chronic systolic (congestive) heart failure: Secondary | ICD-10-CM

## 2016-12-01 DIAGNOSIS — I482 Chronic atrial fibrillation, unspecified: Secondary | ICD-10-CM

## 2016-12-01 DIAGNOSIS — I10 Essential (primary) hypertension: Secondary | ICD-10-CM

## 2016-12-01 MED ORDER — FUROSEMIDE 40 MG PO TABS: 40.0000 mg | ORAL_TABLET | Freq: Two times a day (BID) | ORAL | 3 refills | Status: DC

## 2016-12-01 MED ORDER — METOPROLOL TARTRATE 25 MG PO TABS
25.0000 mg | ORAL_TABLET | Freq: Two times a day (BID) | ORAL | 3 refills | Status: AC
Start: 2016-12-01 — End: 2017-03-01

## 2016-12-01 NOTE — Patient Instructions (Addendum)
Medication Instructions:  Your physician has recommended you make the following change in your medication:    STOP Amiodarone   START Lopressor 25 mg twice daily   START Lasix 40 mg twice daily   Please continue all other medications as prescribed  Labwork:  BMET  In 2 weeks  Orders given today   Testing/Procedures: NONE  Follow-Up: Your physician recommends that you schedule a follow-up appointment in: 3 MONTHS WITH DR. MCDOWELL  Any Other Special Instructions Will Be Listed Below (If Applicable).  If you need a refill on your cardiac medications before your next appointment, please call your pharmacy.

## 2016-12-01 NOTE — Addendum Note (Signed)
Addended by: Norva PavlovJOYCE, Kimball Appleby on: 12/01/2016 04:11 PM   Modules accepted: Orders

## 2017-02-24 NOTE — Progress Notes (Signed)
Cardiology Office Note  Date: 02/25/2017   ID: [name] [name], DOB Apr 18, 1923, MRN [id number]  PCP: Ignatius Specking, MD  Primary Cardiologist: Nona Dell, MD   Chief Complaint  Patient presents with  . Cardiomyopathy    History of Present Illness: [name] [name] is a 84 y.o. female last seen in November 2018.  She is here today for a routine follow-up visit.  She mainly complains of right shoulder pain and limited range of motion.  She is using a walker, denies any recent falls.  She does not report any chest pain and states that her breathing has been stable.  At the last visit we increased Lasix to 40 mg twice daily and placed Lopressor back on twice daily dosing.  Follow-up lab work is reviewed below.  She was also taken off amiodarone with focus on heart rate control.  I reviewed her medications.  She stated that she would like to try to take her Lasix a little bit earlier in the day to avoid increased urination later in the evening.  She was also back on amiodarone which we discussed stopping.  Past Medical History:  Diagnosis Date  . Arthritis   . Cardiomyopathy (HCC)   . Carotid artery disease (HCC)   . Chronic atrial fibrillation (HCC)   . GERD (gastroesophageal reflux disease)   . Hypertension   . Hypothyroidism   . Vasovagal syncope     Past Surgical History:  Procedure Laterality Date  . CHOLECYSTECTOMY    . CORNEAL TRANSPLANT    . Dental implants      Current Outpatient Medications  Medication Sig Dispense Refill  . aspirin EC 81 MG tablet Take 81 mg by mouth daily.    . calcium carbonate (TUMS - DOSED IN MG ELEMENTAL CALCIUM) 500 MG chewable tablet Chew 1 tablet by mouth 2 (two) times daily.    . cholecalciferol (VITAMIN D) 1000 units tablet Take 1 tablet by mouth daily.  12  . furosemide (LASIX) 40 MG tablet Take 1 tablet (40 mg total) by mouth 2 (two) times daily. 60 tablet 3  . levothyroxine (SYNTHROID, LEVOTHROID) 50 MCG tablet Take 50 mcg by mouth daily  before breakfast.    . metoprolol tartrate (LOPRESSOR) 25 MG tablet Take 1 tablet (25 mg total) by mouth 2 (two) times daily. 180 tablet 3  . Multiple Vitamin (MULTIVITAMIN) tablet Take 1 tablet by mouth daily.     No current facility-administered medications for this visit.    Allergies:  Acetaminophen; Hydrocortisone; Nsaids; Saccharin; and Mold extract [trichophyton]   Social History: The patient  reports that  has never smoked. she has never used smokeless tobacco.   ROS:  Please see the history of present illness. Otherwise, complete review of systems is positive for hearing loss.  All other systems are reviewed and negative.   Physical Exam: VS:  BP 106/69   Pulse 78   Ht 5' 1.5" (1.562 m)   Wt 110 lb 9.6 oz (50.2 kg)   SpO2 92%   BMI 20.56 kg/m , BMI Body mass index is 20.56 kg/m.  Wt Readings from Last 3 Encounters:  02/25/17 110 lb 9.6 oz (50.2 kg)  12/01/16 125 lb 9.6 oz (57 kg)  08/02/16 113 lb (51.3 kg)    General: Chronically ill-appearing elderly woman, appears comfortable at rest. HEENT: Conjunctiva and lids normal, oropharynx clear. Neck: Supple, no elevated JVP or carotid bruits, no thyromegaly. Lungs: No wheezing, nonlabored breathing at rest. Cardiac: Irregularly irregular, no  S3, soft systolic murmur. Abdomen: Soft, nontender, bowel sounds present. Extremities: Mild ankle edema, distal pulses 2+. Skin: Warm and dry. Musculoskeletal: Kyphosis. Neuropsychiatric: Alert and oriented x3, affect grossly appropriate.  ECG: I personally reviewed the tracing from 10/25/2016 which showed atrial flutter with 2:1 block and IVCD of left bundle branch block type.  Recent Labwork:  December 2018: BUN 25, creatinine 1.25, potassium 3.9  Other Studies Reviewed Today:  Echocardiogram 03/17/2016 [facility]): LVEF 25-30%, moderate left atrial enlargement, moderate right atrial enlargement, moderately sclerotic aortic valve with mild aortic regurgitation,  mitral annular calcification, severe tricuspid regurgitation, mild pulmonic regurgitation, moderate size left pleural effusion, RVSP 43 mmHg.  Assessment and Plan:  1.  Chronic systolic heart failure with LVEF 25-30%.  Plan to continue with medical therapy and conservative management.  She is tolerating current dose of Lasix and her weight is down.  2.  Persistent atrial fibrillation/flutter.  Once again we are discontinuing amiodarone since this has not provided any significant rhythm control.  Continue with Lopressor for heart rate control.  She remains on aspirin as discussed previously.  3.  Essential hypertension, no change in current regimen.  Current medicines were reviewed with the patient today.  Disposition: Follow-up in 6 months.  Signed, Jonelle SidleSamuel G. McDowell, MD, [facility] CenterFACC 02/25/2017 2:23 PM    [facility] [address], [place], [place] [address] Phone: [phone]) [phone]; Fax: [phone]

## 2017-02-25 ENCOUNTER — Encounter: Payer: Self-pay | Admitting: Cardiology

## 2017-02-25 ENCOUNTER — Ambulatory Visit (INDEPENDENT_AMBULATORY_CARE_PROVIDER_SITE_OTHER): Payer: Medicare Other | Admitting: Cardiology

## 2017-02-25 VITALS — BP 106/69 | HR 78 | Ht 61.5 in | Wt 110.6 lb

## 2017-02-25 DIAGNOSIS — I10 Essential (primary) hypertension: Secondary | ICD-10-CM

## 2017-02-25 DIAGNOSIS — I481 Persistent atrial fibrillation: Secondary | ICD-10-CM | POA: Diagnosis not present

## 2017-02-25 DIAGNOSIS — I4819 Other persistent atrial fibrillation: Secondary | ICD-10-CM

## 2017-02-25 DIAGNOSIS — I5022 Chronic systolic (congestive) heart failure: Secondary | ICD-10-CM

## 2017-02-25 NOTE — Patient Instructions (Signed)
Medication Instructions:   Stop Amiodarone.  Continue all other medications.    Labwork: none  Testing/Procedures: none  Follow-Up: Your physician wants you to follow up in: 6 months.  You will receive a reminder letter in the mail one-two months in advance.  If you don't receive a letter, please call our office to schedule the follow up appointment   Any Other Special Instructions Will Be Listed Below (If Applicable).  If you need a refill on your cardiac medications before your next appointment, please call your pharmacy.  

## 2017-03-03 ENCOUNTER — Other Ambulatory Visit: Payer: Self-pay | Admitting: Cardiology

## 2017-08-22 ENCOUNTER — Telehealth: Payer: Self-pay | Admitting: Cardiology

## 2017-08-22 NOTE — Telephone Encounter (Signed)
No, I think that we could cancel the scheduled visits in light of the current situation.

## 2017-08-22 NOTE — Telephone Encounter (Signed)
Patients family made aware

## 2017-08-22 NOTE — Telephone Encounter (Signed)
Patient is now in hospice.  They are wanting to know if appointment is necessary.

## 2017-08-29 ENCOUNTER — Ambulatory Visit: Payer: Medicare Other | Admitting: Cardiology

## 2017-09-11 DEATH — deceased

## 2017-09-28 ENCOUNTER — Telehealth: Payer: Self-pay | Admitting: Cardiology

## 2017-09-28 NOTE — Telephone Encounter (Signed)
Numerous attempts to contact patient with recall letters. Unable to reach by telephone. with no success.  Smith, Stephanie R [[id number]][[id number]] 02/25/2017 2:19 PM New [1Geraldine Contras0]    [System] 05/11/2017 11:03 PM Notification Sent [20]   Geraldine ContrasSmith, Stephanie R [[id number]][[id number]] 05/26/2017 4:06 PM Scheduled/Linked [30]   Norva PavlovJoyce, Kailey, LPN [[id number]][[id number]] 08/22/2017 4:16 PM Notification Sent [20]
